# Patient Record
Sex: Male | Born: 1945
Health system: Southern US, Community
[De-identification: ages and names within clinical notes are randomized; demographics above are authoritative.]

## PROBLEM LIST (undated history)

## (undated) DIAGNOSIS — E042 Nontoxic multinodular goiter: Secondary | ICD-10-CM

## (undated) DIAGNOSIS — D696 Thrombocytopenia, unspecified: Secondary | ICD-10-CM

## (undated) DIAGNOSIS — I10 Essential (primary) hypertension: Secondary | ICD-10-CM

## (undated) DIAGNOSIS — M1990 Unspecified osteoarthritis, unspecified site: Secondary | ICD-10-CM

## (undated) HISTORY — DX: Nontoxic multinodular goiter: E04.2

## (undated) HISTORY — DX: Essential (primary) hypertension: I10

## (undated) HISTORY — PX: COLONOSCOPY: SHX174

## (undated) HISTORY — DX: Thrombocytopenia, unspecified: D69.6

---

## 1998-05-10 HISTORY — PX: SHOULDER ARTHROSCOPY: SHX128

## 2000-11-15 ENCOUNTER — Ambulatory Visit (HOSPITAL_COMMUNITY): Admission: RE | Admit: 2000-11-15 | Discharge: 2000-11-15 | Payer: Self-pay | Admitting: Family Medicine

## 2000-11-15 ENCOUNTER — Encounter: Payer: Self-pay | Admitting: Family Medicine

## 2002-05-10 HISTORY — PX: CERVICAL FUSION: SHX112

## 2002-08-29 ENCOUNTER — Encounter: Payer: Self-pay | Admitting: Family Medicine

## 2002-08-29 ENCOUNTER — Ambulatory Visit (HOSPITAL_COMMUNITY): Admission: RE | Admit: 2002-08-29 | Discharge: 2002-08-29 | Payer: Self-pay | Admitting: Family Medicine

## 2002-12-17 ENCOUNTER — Ambulatory Visit (HOSPITAL_COMMUNITY): Admission: RE | Admit: 2002-12-17 | Discharge: 2002-12-17 | Payer: Self-pay | Admitting: Neurosurgery

## 2002-12-26 ENCOUNTER — Encounter: Payer: Self-pay | Admitting: Neurosurgery

## 2002-12-26 ENCOUNTER — Observation Stay (HOSPITAL_COMMUNITY): Admission: RE | Admit: 2002-12-26 | Discharge: 2002-12-27 | Payer: Self-pay | Admitting: Neurosurgery

## 2007-01-20 ENCOUNTER — Ambulatory Visit (HOSPITAL_COMMUNITY): Admission: RE | Admit: 2007-01-20 | Discharge: 2007-01-20 | Payer: Self-pay | Admitting: Family Medicine

## 2007-02-23 ENCOUNTER — Encounter (HOSPITAL_COMMUNITY): Admission: RE | Admit: 2007-02-23 | Discharge: 2007-04-13 | Payer: Self-pay | Admitting: Endocrinology

## 2007-03-16 ENCOUNTER — Encounter: Admission: RE | Admit: 2007-03-16 | Discharge: 2007-03-16 | Payer: Self-pay | Admitting: Endocrinology

## 2007-03-16 ENCOUNTER — Other Ambulatory Visit: Admission: RE | Admit: 2007-03-16 | Discharge: 2007-03-16 | Payer: Self-pay | Admitting: Diagnostic Radiology

## 2007-03-16 ENCOUNTER — Encounter (INDEPENDENT_AMBULATORY_CARE_PROVIDER_SITE_OTHER): Payer: Self-pay | Admitting: Diagnostic Radiology

## 2008-02-05 ENCOUNTER — Ambulatory Visit: Payer: Self-pay | Admitting: General Practice

## 2009-05-10 HISTORY — PX: INGUINAL HERNIA REPAIR: SUR1180

## 2010-09-15 ENCOUNTER — Ambulatory Visit
Admission: RE | Admit: 2010-09-15 | Discharge: 2010-09-15 | Disposition: A | Discharge status: Home / Self Care | Payer: MEDICARE | Source: Ambulatory Visit | Attending: Family Medicine | Admitting: Family Medicine

## 2010-09-15 ENCOUNTER — Other Ambulatory Visit: Payer: Self-pay | Admitting: Family Medicine

## 2010-09-15 DIAGNOSIS — M549 Dorsalgia, unspecified: Secondary | ICD-10-CM

## 2010-09-25 NOTE — Op Note (Signed)
NAMECARRON, Donald Keith NO.:  [id number]   MEDICAL RECORD NO.:  [id number]                   PATIENT TYPE:  INP   LOCATION:  3003                                 FACILITY:  [facility]   PHYSICIAN:  Coletta Memos, M.D.                  DATE OF BIRTH:  [birth year]   DATE OF PROCEDURE:  12/26/2002  DATE OF DISCHARGE:                                 OPERATIVE REPORT   PREOPERATIVE DIAGNOSIS:  1. Displaced disk, C5-6, C6-7.  2. Cervical spondylosis with myelopathy C5-6, C6-7.  3. Cervical stenosis, C6-7.  4. Cervical radiculopathy.   POSTOPERATIVE DIAGNOSIS:  1. Displaced disk, C5-6, C6-7.  2. Cervical spondylosis with myelopathy C5-6, C6-7.  3. Cervical stenosis, C6-7.  4. Cervical radiculopathy.   OPERATION PERFORMED:  Anterior cervical decompression, C5-6, C6-7.  Anterior  arthrodesis C5 to C7.  Anterior instrumentation C5 to C7. Small stature  Synthes plate 34 mm.  Interbody grafts 7 mm tricortical.   SURGEON:  Coletta Memos, M.D.   ASSISTANT:  Hilda Lias, M.D.   ANESTHESIA:  General.   COMPLICATIONS:  None.   INDICATIONS FOR PROCEDURE:  Braeson Rupe is a 65 year old gentleman who  presented with severe pain in the left upper extremity.  He had severe  foraminal stenosis  and a herniated disk on the left side at C5-6 and C6-7.  I recommended and he agreed to undergo anterior cervical decompression and  fusion.   DESCRIPTION OF PROCEDURE:  Mr. Mohl was brought to the operating room,  intubated and placed under general anesthesia without difficulty.  His head  was placed in a horseshoe head rest under slight extension with 10 pounds of  traction applied via chin strap.  His neck was prepped and he was draped in  a sterile fashion.  I infiltrated 4mL of 0.5% lidocaine 1:200,000 strength  epinephrine into the region, starting in the midline, extending to the  medial border of the left sternocleidomastoid just under the cricoid  cartilage.  I made  the skin incision and took this down to the platysma.  I  dissected above the platysma both rostrally and caudally, then inferior to  the platysma rostrally and caudally. I opened the platysma in a horizontal  fashion.  I then created an avascular plane to the anterior cervical spine,  taking the omohyoid laterally.  I placed a spinal needle and it showed that  I was at C6-7.  I then reflected the longus colli muscles laterally on the  right and left sides.  I placed a distraction pin in C6 and one at C7.  I  opened the disk space with a #15 blade and distracted the disk space then.  I placed self-retaining retractor, brought the microscope into the operative  field.  I completed the diskectomy using pituitary rongeurs, curets and a  high speed drill.  There was a significant  amount of osteophytic spurring  present.  I was able to achieve excellent decompression, I felt of the C7  nerve roots at C6-7.  I placed a 7 mm graft and then turned my attention to  C5-6.  I removed the screw at C7 and placed it in C5.  I then distracted the  space but the screw at C6 then cracked.  So I removed that and the one at  C5.  I then used intervertebral spreaders at C5-6 and I completed the  diskectomy using high speed drill, curets, Kerrison punches also.  This  level was just as spondylitic with as much spurring, especially a large  posterior spur on the inferior posterior aspect of C5.  I was able to drill  right down and then remove it with Kerrison punches.  Decompressed the  spinal canal quite well and both C6 nerve roots.  I placed another 7 mm  graft at that level.  I then with Dr. Cassandria Santee assistance, placed the plate,  two screws in C5, one in C6, two in C7. The one screw was placed in C6  because I did not want to stress the bone since it had a fracture in it  already.  I first drove then tapped in place a screw at each level.  16 mm  rescue screw was placed in the top left position at C5.  X-ray  showed the  plate and screws to be in good position along with the bone plugs.  I then  irrigated the wound.  I then closed the wound in layered fashion using  Vicryl sutures.  Skin reapproximated with Dermabond.  The patient tolerated  the procedure well.                                                Coletta Memos, M.D.    KC/MEDQ  D:  12/26/2002  T:  12/27/2002  Job:  [id number]

## 2010-09-28 ENCOUNTER — Other Ambulatory Visit (HOSPITAL_COMMUNITY): Payer: Self-pay | Admitting: Internal Medicine

## 2010-09-28 DIAGNOSIS — E042 Nontoxic multinodular goiter: Secondary | ICD-10-CM

## 2010-10-08 ENCOUNTER — Encounter (HOSPITAL_COMMUNITY)
Admission: RE | Admit: 2010-10-08 | Discharge: 2010-10-08 | Disposition: A | Discharge status: Home / Self Care | Payer: 59 | Source: Ambulatory Visit | Attending: Internal Medicine | Admitting: Internal Medicine

## 2010-10-08 DIAGNOSIS — E042 Nontoxic multinodular goiter: Secondary | ICD-10-CM

## 2010-10-09 ENCOUNTER — Encounter (HOSPITAL_COMMUNITY)
Admission: RE | Admit: 2010-10-09 | Discharge: 2010-10-09 | Disposition: A | Discharge status: Home / Self Care | Payer: 59 | Source: Ambulatory Visit | Attending: Internal Medicine | Admitting: Internal Medicine

## 2010-10-09 DIAGNOSIS — E042 Nontoxic multinodular goiter: Secondary | ICD-10-CM | POA: Insufficient documentation

## 2010-10-09 MED ORDER — SODIUM IODIDE I 131 CAPSULE
10.7000 | Freq: Once | INTRAVENOUS | Status: AC | PRN
  Administered 2010-10-08: 10.7 via ORAL

## 2010-10-09 MED ORDER — SODIUM PERTECHNETATE TC 99M INJECTION
10.5000 | Freq: Once | INTRAVENOUS | Status: AC | PRN
  Administered 2010-10-09: 11 via INTRAVENOUS

## 2011-08-18 ENCOUNTER — Encounter (HOSPITAL_BASED_OUTPATIENT_CLINIC_OR_DEPARTMENT_OTHER): Payer: Self-pay | Admitting: *Deleted

## 2011-08-23 ENCOUNTER — Encounter (HOSPITAL_BASED_OUTPATIENT_CLINIC_OR_DEPARTMENT_OTHER): Payer: Self-pay | Admitting: Anesthesiology

## 2011-08-23 ENCOUNTER — Ambulatory Visit (HOSPITAL_BASED_OUTPATIENT_CLINIC_OR_DEPARTMENT_OTHER): Payer: 59 | Admitting: Anesthesiology

## 2011-08-23 ENCOUNTER — Encounter (HOSPITAL_BASED_OUTPATIENT_CLINIC_OR_DEPARTMENT_OTHER)
Admission: RE | Disposition: A | Discharge status: Home / Self Care | Payer: Self-pay | Source: Ambulatory Visit | Attending: Orthopedic Surgery

## 2011-08-23 ENCOUNTER — Encounter (HOSPITAL_BASED_OUTPATIENT_CLINIC_OR_DEPARTMENT_OTHER): Payer: Self-pay

## 2011-08-23 ENCOUNTER — Ambulatory Visit (HOSPITAL_BASED_OUTPATIENT_CLINIC_OR_DEPARTMENT_OTHER)
Admission: RE | Admit: 2011-08-23 | Discharge: 2011-08-23 | Disposition: A | Discharge status: Home / Self Care | Payer: 59 | Source: Ambulatory Visit | Attending: Orthopedic Surgery | Admitting: Orthopedic Surgery

## 2011-08-23 DIAGNOSIS — Z981 Arthrodesis status: Secondary | ICD-10-CM | POA: Insufficient documentation

## 2011-08-23 DIAGNOSIS — D1600 Benign neoplasm of scapula and long bones of unspecified upper limb: Secondary | ICD-10-CM | POA: Insufficient documentation

## 2011-08-23 DIAGNOSIS — M25819 Other specified joint disorders, unspecified shoulder: Secondary | ICD-10-CM | POA: Insufficient documentation

## 2011-08-23 DIAGNOSIS — M19019 Primary osteoarthritis, unspecified shoulder: Secondary | ICD-10-CM | POA: Insufficient documentation

## 2011-08-23 DIAGNOSIS — M7541 Impingement syndrome of right shoulder: Secondary | ICD-10-CM

## 2011-08-23 HISTORY — DX: Unspecified osteoarthritis, unspecified site: M19.90

## 2011-08-23 LAB — POCT HEMOGLOBIN-HEMACUE: Hemoglobin: 17 g/dL (ref 13.0–17.0)

## 2011-08-23 SURGERY — SHOULDER ARTHROSCOPY WITH SUBACROMIAL DECOMPRESSION
Anesthesia: General | Site: Shoulder | Laterality: Right | Wound class: Clean

## 2011-08-23 MED ORDER — SUCCINYLCHOLINE CHLORIDE 20 MG/ML IJ SOLN
INTRAMUSCULAR | Status: DC | PRN
  Administered 2011-08-23: 100 mg via INTRAVENOUS

## 2011-08-23 MED ORDER — FENTANYL CITRATE 0.05 MG/ML IJ SOLN
INTRAMUSCULAR | Status: DC | PRN
  Administered 2011-08-23: 25 ug via INTRAVENOUS
  Administered 2011-08-23: 100 ug via INTRAVENOUS
  Administered 2011-08-23 (×3): 25 ug via INTRAVENOUS

## 2011-08-23 MED ORDER — LACTATED RINGERS IV SOLN
INTRAVENOUS | Status: DC
  Administered 2011-08-23 (×2): via INTRAVENOUS

## 2011-08-23 MED ORDER — DEXAMETHASONE SODIUM PHOSPHATE 4 MG/ML IJ SOLN
INTRAMUSCULAR | Status: DC | PRN
  Administered 2011-08-23: 10 mg via INTRAVENOUS

## 2011-08-23 MED ORDER — SODIUM CHLORIDE 0.9 % IR SOLN
Status: DC | PRN
  Administered 2011-08-23: 3000 mL

## 2011-08-23 MED ORDER — FENTANYL CITRATE 0.05 MG/ML IJ SOLN
50.0000 ug | INTRAMUSCULAR | Status: DC | PRN
  Administered 2011-08-23: 100 ug via INTRAVENOUS

## 2011-08-23 MED ORDER — CEFAZOLIN SODIUM 1-5 GM-% IV SOLN
INTRAVENOUS | Status: DC | PRN
  Administered 2011-08-23: 2 g via INTRAVENOUS

## 2011-08-23 MED ORDER — MIDAZOLAM HCL 2 MG/2ML IJ SOLN
1.0000 mg | INTRAMUSCULAR | Status: DC | PRN
  Administered 2011-08-23: 2 mg via INTRAVENOUS

## 2011-08-23 MED ORDER — BUPIVACAINE-EPINEPHRINE PF 0.5-1:200000 % IJ SOLN
INTRAMUSCULAR | Status: DC | PRN
  Administered 2011-08-23: 30 mL

## 2011-08-23 MED ORDER — PROPOFOL 10 MG/ML IV EMUL
INTRAVENOUS | Status: DC | PRN
  Administered 2011-08-23: 250 mg via INTRAVENOUS
  Administered 2011-08-23: 50 mg via INTRAVENOUS

## 2011-08-23 MED ORDER — HYDROMORPHONE HCL PF 1 MG/ML IJ SOLN: 0.2500 mg | INTRAMUSCULAR | Status: DC | PRN

## 2011-08-23 MED ORDER — ONDANSETRON HCL 4 MG/2ML IJ SOLN
INTRAMUSCULAR | Status: DC | PRN
  Administered 2011-08-23: 4 mg via INTRAVENOUS

## 2011-08-23 MED ORDER — OXYCODONE-ACETAMINOPHEN 5-325 MG PO TABS: 1.0000 | ORAL_TABLET | ORAL | Status: AC | PRN

## 2011-08-23 SURGICAL SUPPLY — 85 items
BENZOIN TINCTURE PRP APPL 2/3 (GAUZE/BANDAGES/DRESSINGS) IMPLANT
BLADE 4.2CUDA (BLADE) IMPLANT
BLADE CUDA 5.5 (BLADE) IMPLANT
BLADE CUTTER GATOR 3.5 (BLADE) IMPLANT
BLADE GREAT WHITE 4.2 (BLADE) IMPLANT
BLADE SURG 15 STRL LF DISP TIS (BLADE) IMPLANT
BLADE SURG 15 STRL SS (BLADE)
BLADE SURG ROTATE 9660 (MISCELLANEOUS) IMPLANT
BLADE VORTEX 6.0 (BLADE) IMPLANT
BUR 3.5 LG SPHERICAL (BURR) IMPLANT
BUR OVAL 4.0 (BURR) ×2 IMPLANT
BUR OVAL 6.0 (BURR) IMPLANT
BURR 3.5 LG SPHERICAL (BURR)
CANISTER OMNI JUG 16 LITER (MISCELLANEOUS) ×2 IMPLANT
CANISTER SUCTION 2500CC (MISCELLANEOUS) IMPLANT
CANNULA 5.75X71 LONG (CANNULA) ×2 IMPLANT
CANNULA TWIST IN 8.25X7CM (CANNULA) IMPLANT
CHLORAPREP W/TINT 26ML (MISCELLANEOUS) ×2 IMPLANT
CLOTH BEACON ORANGE TIMEOUT ST (SAFETY) ×2 IMPLANT
DECANTER SPIKE VIAL GLASS SM (MISCELLANEOUS) IMPLANT
DERMABOND ADVANCED (GAUZE/BANDAGES/DRESSINGS)
DERMABOND ADVANCED .7 DNX12 (GAUZE/BANDAGES/DRESSINGS) IMPLANT
DRAPE INCISE IOBAN 66X45 STRL (DRAPES) ×2 IMPLANT
DRAPE STERI 35X30 U-POUCH (DRAPES) ×2 IMPLANT
DRAPE SURG 17X23 STRL (DRAPES) ×2 IMPLANT
DRAPE U 20/CS (DRAPES) ×2 IMPLANT
DRAPE U-SHAPE 47X51 STRL (DRAPES) ×2 IMPLANT
DRAPE U-SHAPE 76X120 STRL (DRAPES) ×4 IMPLANT
DRSG PAD ABDOMINAL 8X10 ST (GAUZE/BANDAGES/DRESSINGS) ×2 IMPLANT
ELECT REM PT RETURN 9FT ADLT (ELECTROSURGICAL) ×2
ELECTRODE REM PT RTRN 9FT ADLT (ELECTROSURGICAL) ×1 IMPLANT
GAUZE SPONGE 4X4 16PLY XRAY LF (GAUZE/BANDAGES/DRESSINGS) IMPLANT
GAUZE XEROFORM 1X8 LF (GAUZE/BANDAGES/DRESSINGS) ×2 IMPLANT
GLOVE BIO SURGEON STRL SZ 6.5 (GLOVE) ×2 IMPLANT
GLOVE BIO SURGEON STRL SZ7 (GLOVE) ×2 IMPLANT
GLOVE BIO SURGEON STRL SZ7.5 (GLOVE) ×2 IMPLANT
GLOVE BIOGEL PI IND STRL 7.0 (GLOVE) ×2 IMPLANT
GLOVE BIOGEL PI IND STRL 8 (GLOVE) ×2 IMPLANT
GLOVE BIOGEL PI INDICATOR 7.0 (GLOVE) ×2
GLOVE BIOGEL PI INDICATOR 8 (GLOVE) ×2
GOWN PREVENTION PLUS XLARGE (GOWN DISPOSABLE) ×6 IMPLANT
NDL SUT 6 .5 CRC .975X.05 MAYO (NEEDLE) IMPLANT
NEEDLE 1/2 CIR CATGUT .05X1.09 (NEEDLE) IMPLANT
NEEDLE MAYO TAPER (NEEDLE)
NEEDLE SCORPION MULTI FIRE (NEEDLE) IMPLANT
NS IRRIG 1000ML POUR BTL (IV SOLUTION) IMPLANT
PACK ARTHROSCOPY DSU (CUSTOM PROCEDURE TRAY) ×2 IMPLANT
PACK BASIN DAY SURGERY FS (CUSTOM PROCEDURE TRAY) ×2 IMPLANT
PENCIL BUTTON HOLSTER BLD 10FT (ELECTRODE) IMPLANT
RESECTOR FULL RADIUS 4.2MM (BLADE) ×2 IMPLANT
RESECTOR FULL RADIUS 4.8MM (BLADE) IMPLANT
SHEET MEDIUM DRAPE 40X70 STRL (DRAPES) IMPLANT
SLEEVE SCD COMPRESS KNEE MED (MISCELLANEOUS) ×2 IMPLANT
SLING ARM FOAM STRAP LRG (SOFTGOODS) ×2 IMPLANT
SLING ARM FOAM STRAP MED (SOFTGOODS) IMPLANT
SLING ARM FOAM STRAP XLG (SOFTGOODS) IMPLANT
SLING ARM IMMOBILIZER MED (SOFTGOODS) IMPLANT
SPONGE GAUZE 4X4 12PLY (GAUZE/BANDAGES/DRESSINGS) ×2 IMPLANT
SPONGE LAP 4X18 X RAY DECT (DISPOSABLE) IMPLANT
STRIP CLOSURE SKIN 1/2X4 (GAUZE/BANDAGES/DRESSINGS) IMPLANT
SUCTION FRAZIER TIP 10 FR DISP (SUCTIONS) IMPLANT
SUPPORT WRAP ARM LG (MISCELLANEOUS) IMPLANT
SUT BONE WAX W31G (SUTURE) IMPLANT
SUT ETHIBOND 2 OS 4 DA (SUTURE) IMPLANT
SUT ETHILON 3 0 PS 1 (SUTURE) ×2 IMPLANT
SUT ETHILON 4 0 PS 2 18 (SUTURE) IMPLANT
SUT FIBERWIRE #2 38 T-5 BLUE (SUTURE)
SUT FIBERWIRE 2-0 18 17.9 3/8 (SUTURE)
SUT MNCRL AB 3-0 PS2 18 (SUTURE) IMPLANT
SUT MNCRL AB 4-0 PS2 18 (SUTURE) IMPLANT
SUT PDS AB 0 CT 36 (SUTURE) IMPLANT
SUT PROLENE 3 0 PS 2 (SUTURE) IMPLANT
SUT VIC AB 0 CT1 18XCR BRD 8 (SUTURE) IMPLANT
SUT VIC AB 0 CT1 8-18 (SUTURE)
SUT VIC AB 2-0 SH 18 (SUTURE) IMPLANT
SUTURE FIBERWR #2 38 T-5 BLUE (SUTURE) IMPLANT
SUTURE FIBERWR 2-0 18 17.9 3/8 (SUTURE) IMPLANT
SYR BULB 3OZ (MISCELLANEOUS) IMPLANT
TOWEL OR 17X24 6PK STRL BLUE (TOWEL DISPOSABLE) ×2 IMPLANT
TOWEL OR NON WOVEN STRL DISP B (DISPOSABLE) ×2 IMPLANT
TUBE CONNECTING 20X1/4 (TUBING) ×2 IMPLANT
TUBING ARTHROSCOPY IRRIG 16FT (MISCELLANEOUS) ×2 IMPLANT
WAND STAR VAC 90 (SURGICAL WAND) ×2 IMPLANT
WATER STERILE IRR 1000ML POUR (IV SOLUTION) ×2 IMPLANT
YANKAUER SUCT BULB TIP NO VENT (SUCTIONS) IMPLANT

## 2011-08-23 NOTE — Anesthesia Preprocedure Evaluation (Signed)
Anesthesia Evaluation  Patient identified by MRN, date of birth, ID band Patient awake    Reviewed: Allergy & Precautions, H&P , NPO status , Patient's Chart, lab work & pertinent test results  History of Anesthesia Complications Negative for: history of anesthetic complications  Airway Mallampati: II TM Distance: >3 FB Neck ROM: Full    Dental No notable dental hx. (+) Teeth Intact and Dental Advisory Given   Pulmonary neg pulmonary ROS,  breath sounds clear to auscultation  Pulmonary exam normal       Cardiovascular negative cardio ROS  Rhythm:Regular Rate:Normal     Neuro/Psych negative neurological ROS     GI/Hepatic negative GI ROS, Neg liver ROS,   Endo/Other  Morbid obesityGoiter, asymptomatic  Renal/GU negative Renal ROS     Musculoskeletal   Abdominal (+) + obese,   Peds  Hematology   Anesthesia Other Findings   Reproductive/Obstetrics                           Anesthesia Physical Anesthesia Plan  ASA: II  Anesthesia Plan: General   Post-op Pain Management:    Induction: Intravenous  Airway Management Planned: Oral ETT  Additional Equipment:   Intra-op Plan:   Post-operative Plan: Extubation in OR  Informed Consent: I have reviewed the patients History and Physical, chart, labs and discussed the procedure including the risks, benefits and alternatives for the proposed anesthesia with the patient or authorized representative who has indicated his/her understanding and acceptance.   Dental advisory given  Plan Discussed with: Surgeon and CRNA  Anesthesia Plan Comments: (Plan routine monitors, GETA with interscalene block for post-op analgesia)        Anesthesia Quick Evaluation

## 2011-08-23 NOTE — Transfer of Care (Signed)
Immediate Anesthesia Transfer of Care Note  Patient: Donald Keith  Procedure(s) Performed: Procedure(s) (LRB): SHOULDER ARTHROSCOPY WITH SUBACROMIAL DECOMPRESSION (Right)  Patient Location: PACU  Anesthesia Type: General and Regional  Level of Consciousness: awake, alert  and oriented  Airway & Oxygen Therapy: Patient Spontanous Breathing and Patient connected to face mask oxygen  Post-op Assessment: Report given to PACU RN and Post -op Vital signs reviewed and stable  Post vital signs: Reviewed and stable  Complications: No apparent anesthesia complications

## 2011-08-23 NOTE — Op Note (Signed)
Procedure(s): SHOULDER ARTHROSCOPY WITH SUBACROMIAL DECOMPRESSION Procedure Note  Donald Keith male 66 y.o. 08/23/2011  Procedure(s) and Anesthesia Type:    * Right SHOULDER ARTHROSCOPY WITH SUBACROMIAL DECOMPRESSION and distal clavicle excision  - General  Postoperative diagnosis: Severe lateral anterior acromial spurring with impingement and a.c. joint DJD.  Surgeon(s) and Role:    * Mable Paris, MD - Primary     Surgeon: Mable Paris   Assistants: Damita Lack PA-S.  Anesthesia: General endotracheal anesthesia and regional    Procedure Detail  SHOULDER ARTHROSCOPY WITH SUBACROMIAL DECOMPRESSION and distal clavicle excision  Estimated Blood Loss: Min         Drains: none  Blood Given: none         Specimens: none        Complications:  * No complications entered in OR log *         Disposition: PACU - hemodynamically stable.         Condition: stable    Procedure:   INDICATIONS FOR SURGERY: The patient is 66 y.o. male who had a history of prior subacromial decompression. He went on to have recent severe exacerbation of shoulder pain. MRI revealed no tearing of the rotator cuff the x-ray and MRI showed severe reaccumulation of lateral and anterior bone spurs which were sharp and digging down into the rotator cuff. He also had symptomatic a.c. joint DJD. He failed conservative management and wished to go forward with surgical treatment. He understood risks benefits alternatives to the procedure including but not limited to risk of bleeding infection damage to neurovascular structures and the potential for incomplete pain relief. He elected to go forward with surgery.  OPERATIVE FINDINGS: Examination under anesthesia: No stiffness or instability Diagnostic Arthroscopy:  Glenoid articular cartilage: Intact Humeral head articular cartilage: Intact Labrum: Degenerative superior and posterior labrum tearing debrided Loose bodies:  None Synovitis: None Articular sided rotator cuff: Intact. Mild partial-thickness infraspinatus elevation. Bursal sided rotator cuff: Frayed but intact Coracoacromial ligament: Scarred He had a very large lateral acromial spur extending down laterally in a more moderate anterior spur. He also had significant degenerative changes at the a.c. joint.  DESCRIPTION OF PROCEDURE: The patient was identified in preoperative  holding area where I personally marked the operative site after  verifying site, side, and procedure with the patient. An interscalene block was given by the attending anesthesiologist the holding area.  The patient was taken back to the operating room where general anesthesia was induced without complication and was placed in the beach-chair position with the back  elevated about 60 degrees and all extremities and head and neck carefully padded and  positioned.   The right upper extremity was then prepped and  draped in a standard sterile fashion. The appropriate time-out  procedure was carried out. The patient did receive IV antibiotics  within 30 minutes of incision.   A small posterior portal incision was made and the arthroscope was introduced into the joint. An anterior portal was then established above the subscapularis using needle localization. Small cannula was placed anteriorly. Diagnostic arthroscopy was then carried out with findings as described above.  The shaver was introduced through the anterior portal to gently debride the superior and posterior labral tears down to stable base. The repair was felt necessary. The biceps tendon was intact.  The arthroscope was then introduced into the subacromial space a lateral portal was established with needle localization. The portal was made just anterior to the large lateral acromial spur  which was palpable. The shaver was used through the lateral portal to perform extensive bursectomy. There was significant hypertrophy  bursitis. Coracoacromial ligament was examined and found to be scarred from previous surgery.  Using the ArthroCare A. carefully exposed the lateral and anterior acromial spurs. The lateral spur was noted to extend significantly lateral and inferior down into the lateral gutter. This was carefully exposed. A 4 mm bur was then used from the lateral portal to carefully excise the spur back to the native lateral acromion. I then turned my attention to the anterior acromion where he had some re\re formation of anterior spur as well. This was taken up to a smooth even level. The a.c. joint was exposed arthroscopically. The anterior portal was placed into the a.c. joint and the ArthriCare and bur were used through the anterior cannula to even out and smoothed the lateral acromion at the a.c. joint and then to remove about 8 mm of the distal clavicle and a smooth even resection. The superior and posterior capsule were kept intact. The acromioplasty and distal clavicle excision were then viewed from the lateral portal and the acromioplasty was felt to be complete with significant increase space for the rotator cuff anteriorly and laterally. Slight residual downslope anteriorly was smooth even. I also viewed the a.c. resection from anteriorly and a smooth even. The shaver was then run in the joint a little bit more to remove any bone dust and the arthroscopic equipment was removed from the joint.  TThe arthroscopic equipment was removed from the joint and the portals were closed with 3-0 nylon in an interrupted fashion. Sterile dressings were then applied including Xeroform 4 x 4's ABDs and tape. The patient was then allowed to awaken from general anesthesia, placed in a sling, transferred to the stretcher and taken to the recovery room in stable condition.   POSTOPERATIVE PLAN: The patient will be discharged home today and will followup in one week for suture removal and wound check.  He will begin some gentle range  of motion exercises when comfortable.

## 2011-08-23 NOTE — Anesthesia Procedure Notes (Addendum)
Anesthesia Regional Block:  Interscalene brachial plexus block  Pre-Anesthetic Checklist: ,, timeout performed, Correct Patient, Correct Site, Correct Laterality, Correct Procedure, Correct Position, site marked, Risks and benefits discussed,  Surgical consent,  Pre-op evaluation,  At surgeon's request and post-op pain management  Laterality: Right  Prep: chloraprep       Needles:  Injection technique: Single-shot  Needle Type: Stimulator Needle - 40      Needle Gauge: 22 and 22 G    Additional Needles:  Procedures: nerve stimulator Interscalene brachial plexus block  Nerve Stimulator or Paresthesia:  Response: forearm twitch, 0.45 mA, 0.1 ms,   Additional Responses:   Narrative:  Start time: 08/23/2011 11:56 AM End time: 08/23/2011 12:02 PM Injection made incrementally with aspirations every 5 mL.  Performed by: Personally  Anesthesiologist: Sandford Craze, MD  Additional Notes: Pt identified in Holding room.  Monitors applied. Working IV access confirmed. Sterile prep R neck.  #22ga PNS to forearm twitch at 0.61mA threshold.  30cc 0.5% Bupivacaine with 1:200k epi injected incrementally after negative test dose.  Patient asymptomatic, VSS, no heme aspirated, tolerated well.   Sandford Craze, MD  Interscalene brachial plexus block Procedure Name: Intubation Date/Time: 08/23/2011 12:36 PM Performed by: Burna Cash Pre-anesthesia Checklist: Patient identified, Emergency Drugs available, Suction available and Patient being monitored Patient Re-evaluated:Patient Re-evaluated prior to inductionOxygen Delivery Method: Circle System Utilized Preoxygenation: Pre-oxygenation with 100% oxygen Intubation Type: IV induction Ventilation: Mask ventilation without difficulty Laryngoscope Size: Mac and 3 Grade View: Grade I Tube type: Oral Tube size: 8.0 mm Number of attempts: 1 Airway Equipment and Method: stylet and oral airway Placement Confirmation: ETT inserted through vocal cords  under direct vision,  positive ETCO2 and breath sounds checked- equal and bilateral Secured at: 22 cm Tube secured with: Tape Dental Injury: Teeth and Oropharynx as per pre-operative assessment

## 2011-08-23 NOTE — H&P (Signed)
Donald Keith is an 66 y.o. male.   Chief Complaint: R shoulder pain  HPI: R shoulder impingement and AC DJD, failed conservative treatment.  Past Medical History  Diagnosis Date  . Arthritis   . No pertinent past medical history     Past Surgical History  Procedure Date  . Cervical fusion 2004  . Shoulder arthroscopy 2000    rt  . Colonoscopy     History reviewed. No pertinent family history. Social History:  reports that he has never smoked. He does not have any smokeless tobacco history on file. He reports that he does not drink alcohol or use illicit drugs.  Allergies:  Allergies  Allergen Reactions  . Codeine Nausea And Vomiting    Medications Prior to Admission  Medication Dose Route Frequency Provider Last Rate Last Dose  . fentaNYL (SUBLIMAZE) injection 50-100 mcg  50-100 mcg Intravenous PRN Germaine Pomfret, MD   100 mcg at 08/23/11 1155  . lactated ringers infusion   Intravenous Continuous Bedelia Person, MD 20 mL/hr at 08/23/11 1140    . midazolam (VERSED) injection 1-2 mg  1-2 mg Intravenous PRN E. Jairo Ben, MD   2 mg at 08/23/11 1155   Medications Prior to Admission  Medication Sig Dispense Refill  . Multiple Vitamin (MULTIVITAMIN) capsule Take 1 capsule by mouth daily.        Results for orders placed during the hospital encounter of 08/23/11 (from the past 48 hour(s))  POCT HEMOGLOBIN-HEMACUE     Status: Normal   Collection Time   08/23/11 11:45 AM      Component Value Range Comment   Hemoglobin 17.0  13.0 - 17.0 (g/dL)    No results found.  Review of Systems  All other systems reviewed and are negative.    Blood pressure 139/72, pulse 71, temperature 98.4 F (36.9 C), temperature source Oral, resp. rate 14, height 6' (1.829 m), weight 108.863 kg (240 lb), SpO2 99.00%. Physical Exam  Constitutional: He is oriented to person, place, and time. He appears well-developed and well-nourished.  HENT:  Head: Atraumatic.  Eyes: EOM are normal.    Cardiovascular: Intact distal pulses.   Respiratory: Effort normal.  Musculoskeletal:       TTP over R AC and RC  Neurological: He is alert and oriented to person, place, and time.  Skin: Skin is warm and dry.  Psychiatric: He has a normal mood and affect.     Assessment/Plan  R shoulder impingement and AC DJD, failed conservative treatment. Plan RSA with SAD and DCR Risks / benefits of surgery discussed Consent on chart  NPO for OR Preop antibiotics   Jacquel Redditt WILLIAM 08/23/2011, 12:28 PM

## 2011-08-23 NOTE — Discharge Instructions (Signed)
Discharge Instructions after Arthroscopic Shoulder Surgery ° ° °A sling has been provided for you. You may remove the sling after 72 hours. The sling may be worn for your protection, if you are in a crowd.  °Use ice on the shoulder intermittently over the first 48 hours after surgery.  °Pain medication has been prescribed for you.  °Use your medication liberally over the first 48 hours, and then begin to taper your use. You may take Extra Strength Tylenol or Tylenol only in place of the pain pills. DO NOT take ANY nonsteroidal anti-inflammatory pain medications: Advil, Motrin, Ibuprofen, Aleve, Naproxen, or Naprosyn.  °You may remove your dressing after two days.  °You may shower 5 days after surgery. The incision CANNOT get wet prior to 5 days. Simply allow the water to wash over the site and then pat dry. Do not rub the incision. Make sure your axilla (armpit) is completely dry after showering.  °Take one aspirin a day for 2 weeks after surgery, unless you have an aspirin sensitivity/allergy or asthma.  °Three to 5 times each day you should perform assisted overhead reaching and external rotation (outward turning) exercises with the operative arm. Both exercises should be done with the non-operative arm used as the "therapist arm" while the operative arm remains relaxed. Ten of each exercise should be done three to five times each day. ° ° ° °Overhead reach is helping to lift your stiff arm up as high as it will go. To stretch your overhead reach, lie flat on your back, relax, and grasp the wrist of the tight shoulder with your opposite hand. Using the power in your opposite arm, bring the stiff arm up as far as it is comfortable. Start holding it for ten seconds and then work up to where you can hold it for a count of 30. Breathe slowly and deeply while the arm is moved. Repeat this stretch ten times, trying to help the arm up a little higher each time.  ° ° ° ° ° °External rotation is turning the arm out to  the side while your elbow stays close to your body. External rotation is best stretched while you are lying on your back. Hold a cane, yardstick, broom handle, or dowel in both hands. Bend both elbows to a right angle. Use steady, gentle force from your normal arm to rotate the hand of the stiff shoulder out away from your body. Continue the rotation as far as it will go comfortably, holding it there for a count of 10. Repeat this exercise ten times.  ° ° ° °Please call [phone] during normal business hours or [phone] after hours for any problems. Including the following: ° °- excessive redness of the incisions °- drainage for more than 4 days °- fever of more than 101.5 F ° °*Please note that pain medications will not be refilled after hours or on weekends. ° ° ° °Post Anesthesia Home Care Instructions ° °Activity: °Get plenty of rest for the remainder of the day. A responsible adult should stay with you for 24 hours following the procedure.  °For the next 24 hours, DO NOT: °-Drive a car °-Operate machinery °-Drink alcoholic beverages °-Take any medication unless instructed by your physician °-Make any legal decisions or sign important papers. ° °Meals: °Start with liquid foods such as gelatin or soup. Progress to regular foods as tolerated. Avoid greasy, spicy, heavy foods. If nausea and/or vomiting occur, drink only clear liquids until the nausea and/or vomiting subsides. Call   your physician if vomiting continues. ° °Special Instructions/Symptoms: °Your throat may feel dry or sore from the anesthesia or the breathing tube placed in your throat during surgery. If this causes discomfort, gargle with warm salt water. The discomfort should disappear within 24 hours. ° ° °Regional Anesthesia Blocks ° °1. Numbness or the inability to move the "blocked" extremity may last from 3-48 hours after placement. The length of time depends on the medication injected and your individual response to the medication. If the  numbness is not going away after 48 hours, call your surgeon. ° °2. The extremity that is blocked will need to be protected until the numbness is gone and the  Strength has returned. Because you cannot feel it, you will need to take extra care to avoid injury. Because it may be weak, you may have difficulty moving it or using it. You may not know what position it is in without looking at it while the block is in effect. ° °3. For blocks in the legs and feet, returning to weight bearing and walking needs to be done carefully. You will need to wait until the numbness is entirely gone and the strength has returned. You should be able to move your leg and foot normally before you try and bear weight or walk. You will need someone to be with you when you first try to ensure you do not fall and possibly risk injury. ° °4. Bruising and tenderness at the needle site are common side effects and will resolve in a few days. ° °5. Persistent numbness or new problems with movement should be communicated to the surgeon or the [facility] ([phone])/ [facility] ([phone]). °

## 2011-08-24 NOTE — Anesthesia Postprocedure Evaluation (Signed)
  Anesthesia Post-op Note  Patient: Donald Keith  Procedure(s) Performed: Procedure(s) (LRB): SHOULDER ARTHROSCOPY WITH SUBACROMIAL DECOMPRESSION (Right)  Patient Location: PACU  Anesthesia Type: GA combined with regional for post-op pain  Level of Consciousness: awake, alert  and oriented  Airway and Oxygen Therapy: Patient Spontanous Breathing  Post-op Pain: none  Post-op Assessment: Post-op Vital signs reviewed, Patient's Cardiovascular Status Stable, Respiratory Function Stable, Patent Airway, No signs of Nausea or vomiting and Pain level controlled  Post-op Vital Signs: Reviewed and stable  Complications: No apparent anesthesia complications, patient discharged prior to post op visit, verbal report as above

## 2011-08-27 ENCOUNTER — Encounter (HOSPITAL_BASED_OUTPATIENT_CLINIC_OR_DEPARTMENT_OTHER): Payer: Self-pay

## 2012-06-20 ENCOUNTER — Ambulatory Visit
Admission: RE | Admit: 2012-06-20 | Discharge: 2012-06-20 | Disposition: A | Discharge status: Home / Self Care | Payer: Medicare Other | Source: Ambulatory Visit | Attending: Family Medicine | Admitting: Family Medicine

## 2012-06-20 ENCOUNTER — Other Ambulatory Visit: Payer: Self-pay | Admitting: Family Medicine

## 2012-06-20 DIAGNOSIS — M25562 Pain in left knee: Secondary | ICD-10-CM

## 2012-08-28 ENCOUNTER — Telehealth: Payer: Self-pay | Admitting: Internal Medicine

## 2012-08-28 NOTE — Telephone Encounter (Signed)
Left pt vm to return call about np appt.

## 2012-09-07 ENCOUNTER — Telehealth: Payer: Self-pay | Admitting: Internal Medicine

## 2012-09-07 NOTE — Telephone Encounter (Signed)
S/w pt in re to NP appt 06/02 w/Dr. Arbutus Ped.  Referring Dr. Arvella Merles Dx- Thrombocytopenia Welcome packet mailed.

## 2012-09-12 ENCOUNTER — Telehealth: Payer: Self-pay | Admitting: Internal Medicine

## 2012-09-12 NOTE — Telephone Encounter (Signed)
C/D 09/12/12 for appt. 10/09/12

## 2012-10-05 ENCOUNTER — Telehealth: Payer: Self-pay | Admitting: Internal Medicine

## 2012-10-05 NOTE — Telephone Encounter (Signed)
LVOM FOR PT IN RE TO APPT CANCEL INSTRUCTED PT TO CALL TO CONFIRM CALLED WAS RECEIVED.

## 2012-10-09 ENCOUNTER — Ambulatory Visit: Payer: Medicare Other

## 2012-10-09 ENCOUNTER — Ambulatory Visit: Payer: Medicare Other | Admitting: Internal Medicine

## 2012-10-09 ENCOUNTER — Other Ambulatory Visit: Payer: Medicare Other | Admitting: Lab

## 2013-08-03 ENCOUNTER — Encounter: Payer: Self-pay | Admitting: *Deleted

## 2015-03-14 ENCOUNTER — Telehealth: Payer: Self-pay | Admitting: Internal Medicine

## 2015-03-14 NOTE — Telephone Encounter (Signed)
S/W PT IN REF TO NP APPT. ON 04/14/15@1 :45 REFERRING DR HARRIS THROMBOCYTOPENIA

## 2015-04-11 ENCOUNTER — Other Ambulatory Visit: Payer: Self-pay | Admitting: Medical Oncology

## 2015-04-11 DIAGNOSIS — D696 Thrombocytopenia, unspecified: Secondary | ICD-10-CM

## 2015-04-14 ENCOUNTER — Ambulatory Visit (HOSPITAL_BASED_OUTPATIENT_CLINIC_OR_DEPARTMENT_OTHER): Payer: PPO | Admitting: Internal Medicine

## 2015-04-14 ENCOUNTER — Telehealth: Payer: Self-pay | Admitting: Internal Medicine

## 2015-04-14 ENCOUNTER — Encounter: Payer: Self-pay | Admitting: Internal Medicine

## 2015-04-14 ENCOUNTER — Other Ambulatory Visit (HOSPITAL_BASED_OUTPATIENT_CLINIC_OR_DEPARTMENT_OTHER): Payer: PPO

## 2015-04-14 VITALS — BP 162/87 | HR 71 | Temp 98.4°F | Resp 19 | Ht 72.0 in | Wt 231.4 lb

## 2015-04-14 DIAGNOSIS — D696 Thrombocytopenia, unspecified: Secondary | ICD-10-CM

## 2015-04-14 DIAGNOSIS — E042 Nontoxic multinodular goiter: Secondary | ICD-10-CM | POA: Diagnosis not present

## 2015-04-14 DIAGNOSIS — D693 Immune thrombocytopenic purpura: Secondary | ICD-10-CM

## 2015-04-14 LAB — CBC WITH DIFFERENTIAL/PLATELET
BASO%: 1.4 % (ref 0.0–2.0)
BASOS ABS: 0.1 10*3/uL (ref 0.0–0.1)
EOS%: 1.2 % (ref 0.0–7.0)
Eosinophils Absolute: 0.1 10*3/uL (ref 0.0–0.5)
HCT: 46.9 % (ref 38.4–49.9)
HGB: 15.7 g/dL (ref 13.0–17.1)
LYMPH%: 25.5 % (ref 14.0–49.0)
MCH: 32 pg (ref 27.2–33.4)
MCHC: 33.6 g/dL (ref 32.0–36.0)
MCV: 95.4 fL (ref 79.3–98.0)
MONO#: 0.7 10*3/uL (ref 0.1–0.9)
MONO%: 8.2 % (ref 0.0–14.0)
NEUT#: 5.4 10*3/uL (ref 1.5–6.5)
NEUT%: 63.7 % (ref 39.0–75.0)
Platelets: 108 10*3/uL — ABNORMAL LOW (ref 140–400)
RBC: 4.91 10*6/uL (ref 4.20–5.82)
RDW: 13.7 % (ref 11.0–14.6)
WBC: 8.5 10*3/uL (ref 4.0–10.3)
lymph#: 2.2 10*3/uL (ref 0.9–3.3)

## 2015-04-14 LAB — COMPREHENSIVE METABOLIC PANEL
ALT: 15 U/L (ref 0–55)
ANION GAP: 10 meq/L (ref 3–11)
AST: 16 U/L (ref 5–34)
Albumin: 4.2 g/dL (ref 3.5–5.0)
Alkaline Phosphatase: 71 U/L (ref 40–150)
BUN: 13.9 mg/dL (ref 7.0–26.0)
CHLORIDE: 108 meq/L (ref 98–109)
CO2: 21 meq/L — AB (ref 22–29)
CREATININE: 1.1 mg/dL (ref 0.7–1.3)
Calcium: 9.3 mg/dL (ref 8.4–10.4)
EGFR: 70 mL/min/{1.73_m2} — ABNORMAL LOW (ref >90)
Glucose: 97 mg/dl (ref 70–140)
POTASSIUM: 4.1 meq/L (ref 3.5–5.1)
Sodium: 139 mEq/L (ref 136–145)
Total Bilirubin: 0.68 mg/dL (ref 0.20–1.20)
Total Protein: 7.6 g/dL (ref 6.4–8.3)

## 2015-04-14 NOTE — Progress Notes (Signed)
[place] Telephone:(336) [phone]   Fax:([phone]  CONSULT NOTE  REFERRING PHYSICIAN: Dr. Azalia Bilis  REASON FOR CONSULTATION:  69 years old white male with persistent thrombocytopenia  HPI Donald Keith is a 69 y.o. male with past medical history significant for nontoxic goiter currently on observation as well as history of osteoarthritis. The patient was seen recently by his primary care physician Dr. Kenton Kingfisher for routine annual exam and follow-up. CBC performed on 02/11/2015 showed low platelets count of 83,000. The patient has thrombocytopenia for few years. On 07/24/2012 his platelets count were 97,000. On 09/07/2013 his platelets count were 108,000 and on 11/05/2014 his platelets count were 89,000. He was referred to me for further evaluation and recommendation regarding his persistent thrombocytopenia. The patient is feeling fine and no specific complaints. He denied having any significant bleeding, bruises or ecchymosis. He has no gum or nose bleeding. The patient used to take ibuprofen for arthritis but quit taking this medication 6 months ago. He takes only multivitamins as needed basis.  He denied having any significant weight loss or night sweats. The patient denied having any nausea, vomiting, diarrhea or constipation. He has no chest pain, shortness breath, cough or hemoptysis. His family history significant for father with heart disease, diabetes and skin cancer. Mother had myocardial infarction and hypertension. He also has a sister who died from complication of diabetes mellitus and hypertension. The patient is single and has no children. He works as a Dealer. He has no history of smoking, alcohol or drug abuse.  HPI  Past Medical History  Diagnosis Date  . Arthritis   . Multinodular goiter   . Thrombocytopenia [facility])     Past Surgical History  Procedure Laterality Date  . Cervical fusion  2004    C5-7  . Shoulder arthroscopy  2000    right  rotator cuff  . Colonoscopy    . Inguinal hernia repair Bilateral 2011    Family History  Problem Relation Age of Onset  . Skin cancer Father   . Heart disease Father   . Diabetes Father   . Heart attack Mother   . Hypertension Mother   . Diabetes Mother   . Diabetes Maternal Grandfather   . Diabetes Maternal Grandmother   . Diabetes Sister   . Hyperlipidemia Sister     Social History Social History  Substance Use Topics  . Smoking status: Never Smoker   . Smokeless tobacco: None  . Alcohol Use: No    Allergies  Allergen Reactions  . Codeine Nausea And Vomiting    No current outpatient prescriptions on file.   No current facility-administered medications for this visit.    Review of Systems  Constitutional: negative Eyes: negative Ears, nose, mouth, throat, and face: negative Respiratory: negative Cardiovascular: negative Gastrointestinal: negative Genitourinary:negative Integument/breast: negative Hematologic/lymphatic: negative Musculoskeletal:negative Neurological: negative Behavioral/Psych: negative Endocrine: negative Allergic/Immunologic: negative  Physical Exam  [id number], healthy, no distress, well nourished and well developed SKIN: skin color, texture, turgor are normal, no rashes or significant lesions HEAD: Normocephalic, No masses, lesions, tenderness or abnormalities EYES: normal, PERRLA EARS: External ears normal, Canals clear OROPHARYNX:no exudate, no erythema and lips, buccal mucosa, and tongue normal  NECK: supple, no adenopathy, no JVD LYMPH:  no palpable lymphadenopathy, no hepatosplenomegaly LUNGS: clear to auscultation , and palpation HEART: regular rate & rhythm, no murmurs and no gallops ABDOMEN:abdomen soft, non-tender, normal bowel sounds and no masses or organomegaly BACK: Back symmetric, no curvature., No CVA  tenderness EXTREMITIES:no joint deformities, effusion, or inflammation, no edema, no skin discoloration  NEURO:  alert & oriented x 3 with fluent speech, no focal motor/sensory deficits  PERFORMANCE STATUS: ECOG 0  LABORATORY DATA: Lab Results  Component Value Date   WBC 8.5 04/14/2015   HGB 15.7 04/14/2015   HCT 46.9 04/14/2015   MCV 95.4 04/14/2015   PLT 108 Large platelets present* 04/14/2015      Chemistry      Component Value Date/Time   NA 139 04/14/2015 1323   K 4.1 04/14/2015 1323   CO2 21* 04/14/2015 1323   BUN 13.9 04/14/2015 1323   CREATININE 1.1 04/14/2015 1323      Component Value Date/Time   CALCIUM 9.3 04/14/2015 1323   ALKPHOS 71 04/14/2015 1323   AST 16 04/14/2015 1323   ALT 15 04/14/2015 1323   BILITOT 0.68 04/14/2015 1323       RADIOGRAPHIC STUDIES: No results found.  ASSESSMENT: This is a very pleasant 69 years old white male with persistent thrombocytopenia most likely immune mediated thrombocytopenic purpura in a patient with significant thyroid goiter. He is not currently on any medications that could resulted this thrombocytopenia. He has no other cytopenias. I personally reviewed the peripheral blood smear and it showed large platelet but no other concerning findings.  PLAN: I had a lengthy discussion with the patient about his condition. I assured the patient that his platelets count are not critically low. His platelets count today are 108,000 which is much better than what the patient had few weeks ago. I recommended for him to continue on observation with routine follow-up visit by his primary care physician but I will also see the patient back for follow-up visit in 6 months for reevaluation with repeat CBC and LDH. I would consider the patient for treatment to his platelets count are less than 30,000 or the patient had significant bleeding issues. The patient was advised to call immediately if he has any concerning symptoms in the interval. The patient voices understanding of current disease status and treatment options and is in agreement with the  current care plan.  All questions were answered. The patient knows to call the clinic with any problems, questions or concerns. We can certainly see the patient much sooner if necessary.  Thank you so much for allowing me to participate in the care of Tresa Res. I will continue to follow up the patient with you and assist in his care.  I spent 40 minutes counseling the patient face to face. The total time spent in the appointment was 60 minutes.  Disclaimer: This note was dictated with voice recognition software. Similar sounding words can inadvertently be transcribed and may not be corrected upon review.   Haider Hornaday K. April 14, 2015, 2:45 PM

## 2015-04-14 NOTE — Telephone Encounter (Signed)
per pof to sch pt appt-gave pt copy of avs °

## 2015-10-13 ENCOUNTER — Ambulatory Visit (HOSPITAL_BASED_OUTPATIENT_CLINIC_OR_DEPARTMENT_OTHER): Payer: PPO | Admitting: Internal Medicine

## 2015-10-13 ENCOUNTER — Other Ambulatory Visit (HOSPITAL_BASED_OUTPATIENT_CLINIC_OR_DEPARTMENT_OTHER): Payer: PPO

## 2015-10-13 ENCOUNTER — Telehealth: Payer: Self-pay | Admitting: Internal Medicine

## 2015-10-13 ENCOUNTER — Encounter: Payer: Self-pay | Admitting: Internal Medicine

## 2015-10-13 VITALS — BP 164/81 | HR 71 | Temp 98.2°F | Resp 20 | Ht 72.0 in | Wt 234.4 lb

## 2015-10-13 DIAGNOSIS — D693 Immune thrombocytopenic purpura: Secondary | ICD-10-CM

## 2015-10-13 LAB — CBC WITH DIFFERENTIAL/PLATELET
BASO%: 0.7 % (ref 0.0–2.0)
Basophils Absolute: 0 10*3/uL (ref 0.0–0.1)
EOS%: 2.4 % (ref 0.0–7.0)
Eosinophils Absolute: 0.1 10*3/uL (ref 0.0–0.5)
HCT: 45.6 % (ref 38.4–49.9)
HEMOGLOBIN: 15.5 g/dL (ref 13.0–17.1)
LYMPH#: 2.4 10*3/uL (ref 0.9–3.3)
LYMPH%: 40.7 % (ref 14.0–49.0)
MCH: 32.6 pg (ref 27.2–33.4)
MCHC: 34 g/dL (ref 32.0–36.0)
MCV: 96 fL (ref 79.3–98.0)
MONO#: 0.5 10*3/uL (ref 0.1–0.9)
MONO%: 9 % (ref 0.0–14.0)
NEUT#: 2.8 10*3/uL (ref 1.5–6.5)
NEUT%: 47.2 % (ref 39.0–75.0)
Platelets: 87 10*3/uL — ABNORMAL LOW (ref 140–400)
RBC: 4.75 10*6/uL (ref 4.20–5.82)
RDW: 13.5 % (ref 11.0–14.6)
WBC: 5.9 10*3/uL (ref 4.0–10.3)
nRBC: 0 % (ref 0–0)

## 2015-10-13 LAB — LACTATE DEHYDROGENASE: LDH: 161 U/L (ref 125–245)

## 2015-10-13 NOTE — Progress Notes (Signed)
[place] Telephone:([phone]   [phone]  OFFICE PROGRESS NOTE  No primary care provider on file. No primary provider on file.  DIAGNOSIS: Persistent thrombocytopenia most likely immune mediated thrombocytopenic purpura in a patient with significant thyroid goiter.   PRIOR THERAPY: None.  CURRENT THERAPY: Observation.  INTERVAL HISTORY: Donald Keith 70 y.o. male returns to the clinic today for six-month follow-up visit. The patient is feeling fine today with no specific complaints. He denied having any significant fatigue or weakness. He denied having any bleeding, bruises or ecchymosis. He has no nausea or vomiting, no fever or chills. He denied having any significant chest pain, shortness of breath, cough or hemoptysis. There is no significant change in his medications. He recently retired. He is here today for evaluation with repeat CBC and LDH.  MEDICAL HISTORY: Past Medical History  Diagnosis Date  . Arthritis   . Multinodular goiter   . Thrombocytopenia (HCC)     ALLERGIES:  is allergic to codeine.  MEDICATIONS:  No current outpatient prescriptions on file.   No current facility-administered medications for this visit.    SURGICAL HISTORY:  Past Surgical History  Procedure Laterality Date  . Cervical fusion  2004    C5-7  . Shoulder arthroscopy  2000    right rotator cuff  . Colonoscopy    . Inguinal hernia repair Bilateral 2011    REVIEW OF SYSTEMS:  A comprehensive review of systems was negative.   PHYSICAL EXAMINATION: General appearance: alert, cooperative and no distress Head: Normocephalic, without obvious abnormality, atraumatic Neck: no adenopathy, no JVD, supple, symmetrical, trachea midline and thyroid not enlarged, symmetric, no tenderness/mass/nodules Lymph nodes: Cervical, supraclavicular, and axillary nodes normal. Resp: clear to auscultation bilaterally Back: symmetric, no curvature. ROM normal. No CVA  tenderness. Cardio: regular rate and rhythm, S1, S2 normal, no murmur, click, rub or gallop GI: soft, non-tender; bowel sounds normal; no masses,  no organomegaly Extremities: extremities normal, atraumatic, no cyanosis or edema  ECOG PERFORMANCE STATUS: 0 - Asymptomatic  Blood pressure 164/81, pulse 71, temperature 98.2 F (36.8 C), temperature source Oral, resp. rate 20, height 6' (1.829 m), weight 234 lb 6.4 oz (106.323 kg), SpO2 99 %.  LABORATORY DATA: Lab Results  Component Value Date   WBC 5.9 10/13/2015   HGB 15.5 10/13/2015   HCT 45.6 10/13/2015   MCV 96.0 10/13/2015   PLT 87 Large platelets present* 10/13/2015      Chemistry      Component Value Date/Time   NA 139 04/14/2015 1323   K 4.1 04/14/2015 1323   CO2 21* 04/14/2015 1323   BUN 13.9 04/14/2015 1323   CREATININE 1.1 04/14/2015 1323      Component Value Date/Time   CALCIUM 9.3 04/14/2015 1323   ALKPHOS 71 04/14/2015 1323   AST 16 04/14/2015 1323   ALT 15 04/14/2015 1323   BILITOT 0.68 04/14/2015 1323       RADIOGRAPHIC STUDIES: No results found.  ASSESSMENT AND PLAN: This is a very pleasant 70 years old white male with persistent thrombocytopenia likely ITP. The patient is currently on observation and there is decline in his platelets count compared to the last visit but still 87,000 with large platelets. I recommended for the patient to continue on observation for now. I would see him back for follow-up visit in 6 months with repeat CBC and LDH. He was advised to call immediately if he has any bleeding, bruises or ecchymosis. The patient  voices understanding of current disease status and treatment options and is in agreement with the current care plan.  All questions were answered. The patient knows to call the clinic with any problems, questions or concerns. We can certainly see the patient much sooner if necessary.   Disclaimer: This note was dictated with voice recognition software. Similar sounding  words can inadvertently be transcribed and may not be corrected upon review.

## 2015-10-13 NOTE — Telephone Encounter (Signed)
Gave and printed appt sched and avs for pt for DEc °

## 2016-01-13 DIAGNOSIS — Z23 Encounter for immunization: Secondary | ICD-10-CM | POA: Diagnosis not present

## 2016-01-13 DIAGNOSIS — Z125 Encounter for screening for malignant neoplasm of prostate: Secondary | ICD-10-CM | POA: Diagnosis not present

## 2016-01-13 DIAGNOSIS — Z0000 Encounter for general adult medical examination without abnormal findings: Secondary | ICD-10-CM | POA: Diagnosis not present

## 2016-01-13 DIAGNOSIS — Z1211 Encounter for screening for malignant neoplasm of colon: Secondary | ICD-10-CM | POA: Diagnosis not present

## 2016-01-13 DIAGNOSIS — M150 Primary generalized (osteo)arthritis: Secondary | ICD-10-CM | POA: Diagnosis not present

## 2016-01-13 DIAGNOSIS — D696 Thrombocytopenia, unspecified: Secondary | ICD-10-CM | POA: Diagnosis not present

## 2016-01-13 DIAGNOSIS — R030 Elevated blood-pressure reading, without diagnosis of hypertension: Secondary | ICD-10-CM | POA: Diagnosis not present

## 2016-01-13 DIAGNOSIS — E042 Nontoxic multinodular goiter: Secondary | ICD-10-CM | POA: Diagnosis not present

## 2016-01-28 DIAGNOSIS — Z1211 Encounter for screening for malignant neoplasm of colon: Secondary | ICD-10-CM | POA: Diagnosis not present

## 2016-02-26 DIAGNOSIS — H2513 Age-related nuclear cataract, bilateral: Secondary | ICD-10-CM | POA: Diagnosis not present

## 2016-04-12 ENCOUNTER — Encounter: Payer: Self-pay | Admitting: Internal Medicine

## 2016-04-12 ENCOUNTER — Other Ambulatory Visit (HOSPITAL_BASED_OUTPATIENT_CLINIC_OR_DEPARTMENT_OTHER): Payer: PPO

## 2016-04-12 ENCOUNTER — Telehealth: Payer: Self-pay | Admitting: Internal Medicine

## 2016-04-12 ENCOUNTER — Ambulatory Visit (HOSPITAL_BASED_OUTPATIENT_CLINIC_OR_DEPARTMENT_OTHER): Payer: PPO | Admitting: Internal Medicine

## 2016-04-12 VITALS — BP 177/85 | HR 70 | Temp 98.1°F | Resp 18 | Ht 72.0 in | Wt 239.0 lb

## 2016-04-12 DIAGNOSIS — I10 Essential (primary) hypertension: Secondary | ICD-10-CM | POA: Diagnosis not present

## 2016-04-12 DIAGNOSIS — D693 Immune thrombocytopenic purpura: Secondary | ICD-10-CM

## 2016-04-12 DIAGNOSIS — E049 Nontoxic goiter, unspecified: Secondary | ICD-10-CM | POA: Diagnosis not present

## 2016-04-12 HISTORY — DX: Essential (primary) hypertension: I10

## 2016-04-12 LAB — CBC WITH DIFFERENTIAL/PLATELET
BASO%: 1.4 % (ref 0.0–2.0)
Basophils Absolute: 0.1 10*3/uL (ref 0.0–0.1)
EOS%: 2.9 % (ref 0.0–7.0)
Eosinophils Absolute: 0.2 10*3/uL (ref 0.0–0.5)
HCT: 45.8 % (ref 38.4–49.9)
HGB: 15.4 g/dL (ref 13.0–17.1)
LYMPH%: 36.2 % (ref 14.0–49.0)
MCH: 31.9 pg (ref 27.2–33.4)
MCHC: 33.6 g/dL (ref 32.0–36.0)
MCV: 95 fL (ref 79.3–98.0)
MONO#: 0.7 10*3/uL (ref 0.1–0.9)
MONO%: 11.2 % (ref 0.0–14.0)
NEUT%: 48.3 % (ref 39.0–75.0)
NEUTROS ABS: 3.1 10*3/uL (ref 1.5–6.5)
Platelets: 100 10*3/uL — ABNORMAL LOW (ref 140–400)
RBC: 4.83 10*6/uL (ref 4.20–5.82)
RDW: 13.8 % (ref 11.0–14.6)
WBC: 6.3 10*3/uL (ref 4.0–10.3)
lymph#: 2.3 10*3/uL (ref 0.9–3.3)

## 2016-04-12 LAB — LACTATE DEHYDROGENASE: LDH: 174 U/L (ref 125–245)

## 2016-04-12 NOTE — Telephone Encounter (Signed)
Appointments scheduled per 04/12/16 los. A copy of the AVS report and appointment schedule was given to the patient, per 04/12/16 los. °

## 2016-04-12 NOTE — Progress Notes (Signed)
[place] Telephone:([phone]   Fax:(336) [phone]  OFFICE PROGRESS NOTE  No primary care provider on file. No primary provider on file.  DIAGNOSIS: Thrombocytopenia likely ITP.  PRIOR THERAPY: None  CURRENT THERAPY: Observation.  INTERVAL HISTORY: Donald Keith 70 y.o. male returns to the clinic today for six-month follow-up visit. The patient is doing fine today except for uncontrolled hypertension especially when he comes to the visit. He mentioned that his blood pressure is usually normal at home. He does not take any blood pressure medication. He was seen by his primary care physician recently and his blood pressure was good at that time. He will continue to have the enlarged goiter. He has no fever or chills. He has no nausea or vomiting. He denied having any significant chest pain, shortness of breath, cough or hemoptysis. No bleeding, bruises or ecchymosis. He has no weight loss or night sweats.  MEDICAL HISTORY: Past Medical History:  Diagnosis Date  . Arthritis   . Multinodular goiter   . Thrombocytopenia (HCC)     ALLERGIES:  is allergic to codeine.  MEDICATIONS:  No current outpatient prescriptions on file.   No current facility-administered medications for this visit.     SURGICAL HISTORY:  Past Surgical History:  Procedure Laterality Date  . CERVICAL FUSION  2004   C5-7  . COLONOSCOPY    . INGUINAL HERNIA REPAIR Bilateral 2011  . SHOULDER ARTHROSCOPY  2000   right rotator cuff    REVIEW OF SYSTEMS:  A comprehensive review of systems was negative.   PHYSICAL EXAMINATION: General appearance: alert, cooperative and no distress Head: Normocephalic, without obvious abnormality, atraumatic Neck: no adenopathy, no JVD, supple, symmetrical, trachea midline and Significantly enlarged thyroid gland especially the right thyroid lobe Lymph nodes: Cervical, supraclavicular, and axillary nodes normal. Resp: clear to auscultation  bilaterally Back: symmetric, no curvature. ROM normal. No CVA tenderness. Cardio: regular rate and rhythm, S1, S2 normal, no murmur, click, rub or gallop GI: soft, non-tender; bowel sounds normal; no masses,  no organomegaly Extremities: extremities normal, atraumatic, no cyanosis or edema  ECOG PERFORMANCE STATUS: 0 - Asymptomatic  Blood pressure (!) 177/85, pulse 70, temperature 98.1 F (36.7 C), temperature source Oral, resp. rate 18, height 6' (1.829 m), weight 239 lb (108.4 kg), SpO2 97 %.  LABORATORY DATA: Lab Results  Component Value Date   WBC 6.3 04/12/2016   HGB 15.4 04/12/2016   HCT 45.8 04/12/2016   MCV 95.0 04/12/2016   PLT 100 (L) 04/12/2016      Chemistry      Component Value Date/Time   NA 139 04/14/2015 1323   K 4.1 04/14/2015 1323   CO2 21 (L) 04/14/2015 1323   BUN 13.9 04/14/2015 1323   CREATININE 1.1 04/14/2015 1323      Component Value Date/Time   CALCIUM 9.3 04/14/2015 1323   ALKPHOS 71 04/14/2015 1323   AST 16 04/14/2015 1323   ALT 15 04/14/2015 1323   BILITOT 0.68 04/14/2015 1323       RADIOGRAPHIC STUDIES: No results found.  ASSESSMENT AND PLAN: This is a very pleasant 70 years old white male with mild thrombocytopenia likely ITP in a patient with thyroid goiter. He is feeling fine today with no specific complaints. He has no bleeding issues His platelets count are 100,000 today. I recommended for the patient to continue on observation with repeat CBC and LDH in 6 months. I will see him back for follow-up visit at  that time. For hypertension, I strongly advised the patient to monitor his blood pressure closely at home and stayed elevated to consult with his primary care physician. He was advised to call immediately if he has any concerning symptoms in the interval. The patient voices understanding of current disease status and treatment options and is in agreement with the current care plan.  All questions were answered. The patient knows  to call the clinic with any problems, questions or concerns. We can certainly see the patient much sooner if necessary.  Disclaimer: This note was dictated with voice recognition software. Similar sounding words can inadvertently be transcribed and may not be corrected upon review.

## 2016-04-26 DIAGNOSIS — H2513 Age-related nuclear cataract, bilateral: Secondary | ICD-10-CM | POA: Diagnosis not present

## 2016-04-26 DIAGNOSIS — H2512 Age-related nuclear cataract, left eye: Secondary | ICD-10-CM | POA: Diagnosis not present

## 2016-06-01 DIAGNOSIS — H2512 Age-related nuclear cataract, left eye: Secondary | ICD-10-CM | POA: Diagnosis not present

## 2016-06-01 DIAGNOSIS — Z961 Presence of intraocular lens: Secondary | ICD-10-CM | POA: Diagnosis not present

## 2016-06-02 DIAGNOSIS — H2511 Age-related nuclear cataract, right eye: Secondary | ICD-10-CM | POA: Diagnosis not present

## 2016-06-08 DIAGNOSIS — H5211 Myopia, right eye: Secondary | ICD-10-CM | POA: Diagnosis not present

## 2016-06-08 DIAGNOSIS — Z961 Presence of intraocular lens: Secondary | ICD-10-CM | POA: Diagnosis not present

## 2016-06-08 DIAGNOSIS — H2511 Age-related nuclear cataract, right eye: Secondary | ICD-10-CM | POA: Diagnosis not present

## 2016-07-13 DIAGNOSIS — E042 Nontoxic multinodular goiter: Secondary | ICD-10-CM | POA: Diagnosis not present

## 2016-09-01 DIAGNOSIS — Z961 Presence of intraocular lens: Secondary | ICD-10-CM | POA: Diagnosis not present

## 2016-09-01 DIAGNOSIS — H26492 Other secondary cataract, left eye: Secondary | ICD-10-CM | POA: Diagnosis not present

## 2016-09-22 DIAGNOSIS — Z961 Presence of intraocular lens: Secondary | ICD-10-CM | POA: Diagnosis not present

## 2016-09-22 DIAGNOSIS — H26491 Other secondary cataract, right eye: Secondary | ICD-10-CM | POA: Diagnosis not present

## 2016-10-11 ENCOUNTER — Telehealth: Payer: Self-pay | Admitting: Internal Medicine

## 2016-10-11 ENCOUNTER — Other Ambulatory Visit (HOSPITAL_BASED_OUTPATIENT_CLINIC_OR_DEPARTMENT_OTHER): Payer: PPO

## 2016-10-11 ENCOUNTER — Ambulatory Visit (HOSPITAL_BASED_OUTPATIENT_CLINIC_OR_DEPARTMENT_OTHER): Payer: PPO | Admitting: Internal Medicine

## 2016-10-11 ENCOUNTER — Encounter: Payer: Self-pay | Admitting: Internal Medicine

## 2016-10-11 VITALS — BP 162/83 | HR 67 | Temp 98.7°F | Resp 18 | Ht 72.0 in | Wt 242.7 lb

## 2016-10-11 DIAGNOSIS — D693 Immune thrombocytopenic purpura: Secondary | ICD-10-CM | POA: Diagnosis not present

## 2016-10-11 DIAGNOSIS — I10 Essential (primary) hypertension: Secondary | ICD-10-CM

## 2016-10-11 DIAGNOSIS — E049 Nontoxic goiter, unspecified: Secondary | ICD-10-CM

## 2016-10-11 LAB — CBC WITH DIFFERENTIAL/PLATELET
BASO%: 0.9 % (ref 0.0–2.0)
Basophils Absolute: 0.1 10*3/uL (ref 0.0–0.1)
EOS ABS: 0.2 10*3/uL (ref 0.0–0.5)
EOS%: 2.8 % (ref 0.0–7.0)
HEMATOCRIT: 46.4 % (ref 38.4–49.9)
HEMOGLOBIN: 16 g/dL (ref 13.0–17.1)
LYMPH#: 2.3 10*3/uL (ref 0.9–3.3)
LYMPH%: 40.4 % (ref 14.0–49.0)
MCH: 33 pg (ref 27.2–33.4)
MCHC: 34.4 g/dL (ref 32.0–36.0)
MCV: 95.8 fL (ref 79.3–98.0)
MONO#: 0.6 10*3/uL (ref 0.1–0.9)
MONO%: 9.6 % (ref 0.0–14.0)
NEUT%: 46.3 % (ref 39.0–75.0)
NEUTROS ABS: 2.7 10*3/uL (ref 1.5–6.5)
Platelets: 88 10*3/uL — ABNORMAL LOW (ref 140–400)
RBC: 4.84 10*6/uL (ref 4.20–5.82)
RDW: 13.5 % (ref 11.0–14.6)
WBC: 5.8 10*3/uL (ref 4.0–10.3)

## 2016-10-11 LAB — LACTATE DEHYDROGENASE: LDH: 162 U/L (ref 125–245)

## 2016-10-11 NOTE — Telephone Encounter (Signed)
Per 6/4 LOS - no follow up visit is needed.

## 2016-10-11 NOTE — Progress Notes (Signed)
[place] [phone]   Fax:(336) [phone]  OFFICE PROGRESS [place] [id number]  DIAGNOSIS: Thrombocytopenia likely ITP.  PRIOR THERAPY: None  CURRENT THERAPY: Observation.  INTERVAL HISTORY: Donald Keith 71 y.o. male returns to the clinic today for 6 months follow-up visit. The patient is feeling fine today with no specific complaints. He denied having any chest pain, shortness of breath, cough or hemoptysis. He has no bleeding, bruises or ecchymosis. He denied having any weight loss or night sweats. He has no nausea, vomiting, diarrhea or constipation. He says today for evaluation and repeat blood work.  MEDICAL HISTORY: Past Medical History:  Diagnosis Date  . Arthritis   . Hypertension 04/12/2016  . Multinodular goiter   . Thrombocytopenia (HCC)     ALLERGIES:  is allergic to codeine.  MEDICATIONS:  No current outpatient prescriptions on file.   No current facility-administered medications for this visit.     SURGICAL HISTORY:  Past Surgical History:  Procedure Laterality Date  . CERVICAL FUSION  2004   C5-7  . COLONOSCOPY    . INGUINAL HERNIA REPAIR Bilateral 2011  . SHOULDER ARTHROSCOPY  2000   right rotator cuff    REVIEW OF SYSTEMS:  A comprehensive review of systems was negative.   PHYSICAL EXAMINATION: General appearance: alert, cooperative and no distress Head: Normocephalic, without obvious abnormality, atraumatic Neck: no adenopathy, no JVD, supple, symmetrical, trachea midline and Significantly enlarged thyroid gland especially the right thyroid lobe Lymph nodes: Cervical, supraclavicular, and axillary nodes normal. Resp: clear to auscultation bilaterally Back: symmetric, no curvature. ROM normal. No CVA tenderness. Cardio: regular rate and rhythm, S1, S2 normal, no murmur, click, rub or gallop GI: soft, non-tender; bowel sounds normal; no masses,  no  organomegaly Extremities: extremities normal, atraumatic, no cyanosis or edema  ECOG PERFORMANCE STATUS: 0 - Asymptomatic  Blood pressure (!) 162/83, pulse 67, temperature 98.7 F (37.1 C), temperature source Oral, resp. rate 18, height 6' (1.829 m), weight 242 lb 11.2 oz (110.1 kg), SpO2 98 %.  LABORATORY DATA: Lab Results  Component Value Date   WBC 5.8 10/11/2016   HGB 16.0 10/11/2016   HCT 46.4 10/11/2016   MCV 95.8 10/11/2016   PLT 88 (L) 10/11/2016      Chemistry      Component Value Date/Time   NA 139 04/14/2015 1323   K 4.1 04/14/2015 1323   CO2 21 (L) 04/14/2015 1323   BUN 13.9 04/14/2015 1323   CREATININE 1.1 04/14/2015 1323      Component Value Date/Time   CALCIUM 9.3 04/14/2015 1323   ALKPHOS 71 04/14/2015 1323   AST 16 04/14/2015 1323   ALT 15 04/14/2015 1323   BILITOT 0.68 04/14/2015 1323       RADIOGRAPHIC STUDIES: No results found.  ASSESSMENT AND PLAN:  This is a very pleasant 71 years old white male with idiopathic thrombocytopenic purpura as well as thyroid goiter. He is currently on observation and his platelets count today are 88,000. The patient is asymptomatic and denied having any bleeding, bruises or ecchymosis. I recommended for him to continue on observation. He would like to continue his routine follow-up visit by his primary care physician at this point. I would be happy to see him in the future on as-needed basis. For the hypertension, he mentioned that his blood pressure at home is within the normal range. He will continue to monitor and  report to his primary care physician if needed. He was advised to call immediately if he has any concerning symptoms. The patient voices understanding of current disease status and treatment options and is in agreement with the current care plan.  All questions were answered. The patient knows to call the clinic with any problems, questions or concerns. We can certainly see the patient much sooner if  necessary.  Disclaimer: This note was dictated with voice recognition software. Similar sounding words can inadvertently be transcribed and may not be corrected upon review.

## 2016-11-05 DIAGNOSIS — H43812 Vitreous degeneration, left eye: Secondary | ICD-10-CM | POA: Diagnosis not present

## 2017-01-13 DIAGNOSIS — R030 Elevated blood-pressure reading, without diagnosis of hypertension: Secondary | ICD-10-CM | POA: Diagnosis not present

## 2017-01-13 DIAGNOSIS — E042 Nontoxic multinodular goiter: Secondary | ICD-10-CM | POA: Diagnosis not present

## 2017-01-13 DIAGNOSIS — D696 Thrombocytopenia, unspecified: Secondary | ICD-10-CM | POA: Diagnosis not present

## 2017-01-13 DIAGNOSIS — Z125 Encounter for screening for malignant neoplasm of prostate: Secondary | ICD-10-CM | POA: Diagnosis not present

## 2017-01-13 DIAGNOSIS — Z0000 Encounter for general adult medical examination without abnormal findings: Secondary | ICD-10-CM | POA: Diagnosis not present

## 2017-01-13 DIAGNOSIS — D225 Melanocytic nevi of trunk: Secondary | ICD-10-CM | POA: Diagnosis not present

## 2017-01-13 DIAGNOSIS — Z1211 Encounter for screening for malignant neoplasm of colon: Secondary | ICD-10-CM | POA: Diagnosis not present

## 2017-01-13 DIAGNOSIS — Z23 Encounter for immunization: Secondary | ICD-10-CM | POA: Diagnosis not present

## 2017-01-24 DIAGNOSIS — R739 Hyperglycemia, unspecified: Secondary | ICD-10-CM | POA: Diagnosis not present

## 2017-04-27 DIAGNOSIS — Z1211 Encounter for screening for malignant neoplasm of colon: Secondary | ICD-10-CM | POA: Diagnosis not present

## 2017-09-28 DIAGNOSIS — Z9841 Cataract extraction status, right eye: Secondary | ICD-10-CM | POA: Diagnosis not present

## 2017-09-28 DIAGNOSIS — H5203 Hypermetropia, bilateral: Secondary | ICD-10-CM | POA: Diagnosis not present

## 2017-09-28 DIAGNOSIS — H52222 Regular astigmatism, left eye: Secondary | ICD-10-CM | POA: Diagnosis not present

## 2017-09-28 DIAGNOSIS — Z9842 Cataract extraction status, left eye: Secondary | ICD-10-CM | POA: Diagnosis not present

## 2017-09-28 DIAGNOSIS — H43312 Vitreous membranes and strands, left eye: Secondary | ICD-10-CM | POA: Diagnosis not present

## 2018-01-16 DIAGNOSIS — Z23 Encounter for immunization: Secondary | ICD-10-CM | POA: Diagnosis not present

## 2018-01-16 DIAGNOSIS — Z125 Encounter for screening for malignant neoplasm of prostate: Secondary | ICD-10-CM | POA: Diagnosis not present

## 2018-01-16 DIAGNOSIS — M150 Primary generalized (osteo)arthritis: Secondary | ICD-10-CM | POA: Diagnosis not present

## 2018-01-16 DIAGNOSIS — R030 Elevated blood-pressure reading, without diagnosis of hypertension: Secondary | ICD-10-CM | POA: Diagnosis not present

## 2018-01-16 DIAGNOSIS — Z13220 Encounter for screening for lipoid disorders: Secondary | ICD-10-CM | POA: Diagnosis not present

## 2018-01-16 DIAGNOSIS — Z0000 Encounter for general adult medical examination without abnormal findings: Secondary | ICD-10-CM | POA: Diagnosis not present

## 2018-01-16 DIAGNOSIS — E042 Nontoxic multinodular goiter: Secondary | ICD-10-CM | POA: Diagnosis not present

## 2018-06-02 DIAGNOSIS — R030 Elevated blood-pressure reading, without diagnosis of hypertension: Secondary | ICD-10-CM | POA: Diagnosis not present

## 2018-06-02 DIAGNOSIS — L989 Disorder of the skin and subcutaneous tissue, unspecified: Secondary | ICD-10-CM | POA: Diagnosis not present

## 2018-06-09 DIAGNOSIS — L570 Actinic keratosis: Secondary | ICD-10-CM | POA: Diagnosis not present

## 2018-06-09 DIAGNOSIS — Z85828 Personal history of other malignant neoplasm of skin: Secondary | ICD-10-CM | POA: Diagnosis not present

## 2018-06-09 DIAGNOSIS — D0461 Carcinoma in situ of skin of right upper limb, including shoulder: Secondary | ICD-10-CM | POA: Diagnosis not present

## 2018-06-09 DIAGNOSIS — C44622 Squamous cell carcinoma of skin of right upper limb, including shoulder: Secondary | ICD-10-CM | POA: Diagnosis not present

## 2019-01-18 DIAGNOSIS — R030 Elevated blood-pressure reading, without diagnosis of hypertension: Secondary | ICD-10-CM | POA: Diagnosis not present

## 2019-01-18 DIAGNOSIS — Z0000 Encounter for general adult medical examination without abnormal findings: Secondary | ICD-10-CM | POA: Diagnosis not present

## 2019-01-18 DIAGNOSIS — R7303 Prediabetes: Secondary | ICD-10-CM | POA: Diagnosis not present

## 2019-01-18 DIAGNOSIS — D696 Thrombocytopenia, unspecified: Secondary | ICD-10-CM | POA: Diagnosis not present

## 2019-01-18 DIAGNOSIS — Z1211 Encounter for screening for malignant neoplasm of colon: Secondary | ICD-10-CM | POA: Diagnosis not present

## 2019-01-18 DIAGNOSIS — E042 Nontoxic multinodular goiter: Secondary | ICD-10-CM | POA: Diagnosis not present

## 2019-01-18 DIAGNOSIS — M150 Primary generalized (osteo)arthritis: Secondary | ICD-10-CM | POA: Diagnosis not present

## 2019-01-18 DIAGNOSIS — I499 Cardiac arrhythmia, unspecified: Secondary | ICD-10-CM | POA: Diagnosis not present

## 2019-01-18 DIAGNOSIS — Z23 Encounter for immunization: Secondary | ICD-10-CM | POA: Diagnosis not present

## 2019-01-18 DIAGNOSIS — Z125 Encounter for screening for malignant neoplasm of prostate: Secondary | ICD-10-CM | POA: Diagnosis not present

## 2019-03-26 DIAGNOSIS — Z9841 Cataract extraction status, right eye: Secondary | ICD-10-CM | POA: Diagnosis not present

## 2019-03-26 DIAGNOSIS — H5203 Hypermetropia, bilateral: Secondary | ICD-10-CM | POA: Diagnosis not present

## 2019-03-26 DIAGNOSIS — Z9842 Cataract extraction status, left eye: Secondary | ICD-10-CM | POA: Diagnosis not present

## 2019-03-26 DIAGNOSIS — H52223 Regular astigmatism, bilateral: Secondary | ICD-10-CM | POA: Diagnosis not present

## 2019-07-12 ENCOUNTER — Ambulatory Visit: Payer: PPO | Attending: Internal Medicine

## 2019-07-12 DIAGNOSIS — Z23 Encounter for immunization: Secondary | ICD-10-CM | POA: Insufficient documentation

## 2019-07-12 NOTE — Progress Notes (Signed)
   Covid-19 Vaccination Clinic  Name:  LAMARI MOSCONE    MRN: [id number] DOB: 1945-12-03  07/12/2019  Mr. Estabrook was observed post Covid-19 immunization for 15 minutes without incident. He was provided with Vaccine Information Sheet and instruction to access the V-Safe system.   Mr. Amory was instructed to call 911 with any severe reactions post vaccine: Marland Kitchen Difficulty breathing  . Swelling of face and throat  . A fast heartbeat  . A bad rash all over body  . Dizziness and weakness   Immunizations Administered    Name Date Dose VIS Date Route   Pfizer COVID-19 Vaccine 07/12/2019  8:36 AM 0.3 mL 04/20/2019 Intramuscular   Manufacturer: [place]   Lot: [id number]   [place]: [id number]

## 2019-08-02 ENCOUNTER — Ambulatory Visit: Payer: PPO | Attending: Internal Medicine

## 2019-08-02 DIAGNOSIS — Z23 Encounter for immunization: Secondary | ICD-10-CM

## 2019-08-02 NOTE — Progress Notes (Signed)
   Covid-19 Vaccination Clinic  Name:  Donald Keith    MRN: [id number] DOB: 08-15-1945  08/02/2019  Mr. Frieling was observed post Covid-19 immunization for 15 minutes without incident. He was provided with Vaccine Information Sheet and instruction to access the V-Safe system.   Mr. Sponaugle was instructed to call 911 with any severe reactions post vaccine: Marland Kitchen Difficulty breathing  . Swelling of face and throat  . A fast heartbeat  . A bad rash all over body  . Dizziness and weakness   Immunizations Administered    Name Date Dose VIS Date Route   Pfizer COVID-19 Vaccine 08/02/2019  8:27 AM 0.3 mL 04/20/2019 Intramuscular   Manufacturer: [place]   Lot: [id number]   [place]: [id number]

## 2020-01-21 DIAGNOSIS — E042 Nontoxic multinodular goiter: Secondary | ICD-10-CM | POA: Diagnosis not present

## 2020-01-21 DIAGNOSIS — Z1211 Encounter for screening for malignant neoplasm of colon: Secondary | ICD-10-CM | POA: Diagnosis not present

## 2020-01-21 DIAGNOSIS — Z0000 Encounter for general adult medical examination without abnormal findings: Secondary | ICD-10-CM | POA: Diagnosis not present

## 2020-01-21 DIAGNOSIS — M150 Primary generalized (osteo)arthritis: Secondary | ICD-10-CM | POA: Diagnosis not present

## 2020-01-21 DIAGNOSIS — D696 Thrombocytopenia, unspecified: Secondary | ICD-10-CM | POA: Diagnosis not present

## 2020-01-21 DIAGNOSIS — Z125 Encounter for screening for malignant neoplasm of prostate: Secondary | ICD-10-CM | POA: Diagnosis not present

## 2020-01-21 DIAGNOSIS — Z23 Encounter for immunization: Secondary | ICD-10-CM | POA: Diagnosis not present

## 2020-11-27 DIAGNOSIS — R42 Dizziness and giddiness: Secondary | ICD-10-CM | POA: Diagnosis not present

## 2020-11-27 DIAGNOSIS — I10 Essential (primary) hypertension: Secondary | ICD-10-CM | POA: Diagnosis not present

## 2021-01-26 DIAGNOSIS — D696 Thrombocytopenia, unspecified: Secondary | ICD-10-CM | POA: Diagnosis not present

## 2021-01-26 DIAGNOSIS — I10 Essential (primary) hypertension: Secondary | ICD-10-CM | POA: Diagnosis not present

## 2021-01-26 DIAGNOSIS — E042 Nontoxic multinodular goiter: Secondary | ICD-10-CM | POA: Diagnosis not present

## 2021-01-26 DIAGNOSIS — Z1211 Encounter for screening for malignant neoplasm of colon: Secondary | ICD-10-CM | POA: Diagnosis not present

## 2021-01-26 DIAGNOSIS — Z23 Encounter for immunization: Secondary | ICD-10-CM | POA: Diagnosis not present

## 2021-01-26 DIAGNOSIS — R7303 Prediabetes: Secondary | ICD-10-CM | POA: Diagnosis not present

## 2021-01-26 DIAGNOSIS — Z125 Encounter for screening for malignant neoplasm of prostate: Secondary | ICD-10-CM | POA: Diagnosis not present

## 2021-01-26 DIAGNOSIS — M150 Primary generalized (osteo)arthritis: Secondary | ICD-10-CM | POA: Diagnosis not present

## 2021-01-26 DIAGNOSIS — Z0000 Encounter for general adult medical examination without abnormal findings: Secondary | ICD-10-CM | POA: Diagnosis not present

## 2021-02-18 ENCOUNTER — Other Ambulatory Visit: Payer: Self-pay

## 2021-02-18 ENCOUNTER — Ambulatory Visit (INDEPENDENT_AMBULATORY_CARE_PROVIDER_SITE_OTHER): Payer: PPO | Admitting: Interventional Cardiology

## 2021-02-18 ENCOUNTER — Encounter: Payer: Self-pay | Admitting: Interventional Cardiology

## 2021-02-18 VITALS — BP 144/88 | HR 79 | Ht 70.0 in | Wt 234.0 lb

## 2021-02-18 DIAGNOSIS — Z8249 Family history of ischemic heart disease and other diseases of the circulatory system: Secondary | ICD-10-CM

## 2021-02-18 DIAGNOSIS — R42 Dizziness and giddiness: Secondary | ICD-10-CM

## 2021-02-18 DIAGNOSIS — I10 Essential (primary) hypertension: Secondary | ICD-10-CM

## 2021-02-18 NOTE — Progress Notes (Signed)
Cardiology Office Note   Date:  02/18/2021   ID:  Donald Keith, DOB 1945/09/09, MRN [id number]  PCP:  Shirline Frees, MD    No chief complaint on file.  HTN  Wt Readings from Last 3 Encounters:  02/18/21 234 lb (106.1 kg)  10/11/16 242 lb 11.2 oz (110.1 kg)  04/12/16 239 lb (108.4 kg)       History of Present Illness: Donald Keith is a 75 y.o. male who is being seen today for the evaluation of lightheadedness at the request of Shirline Frees, MD.   Amlodipine was started in July 2022 for HTN.  Home readings were in the 170s.  Started at 2.5 mg and then increased to 5 mg a few weeks ago.At home, BP are now in the 130s/80s.    Had lightheadedness with higher blood pressure readings.  This has resolved since the blood pressure is better.  Specifically, there was one episode in July 2022 of lighthededness, but he feels he was dehydrated.  He was drinking more sodas back then.  He is drinking more water now.   Climbs ladders, cleaning gutters, yard work is most strenuous activity.  Knee pain limits walking.  Does walk stairs in his house. No CP or SHOB.  Uses total gym at home. No symptoms with that activity.   Both parents had MI in their later years. Sister passed away at 45 with diabetes.   No smoking history.  Eats a lot of meat as part of regular diet, some processed.    Denies : Chest pain. Dizziness. Leg edema. Nitroglycerin use. Orthopnea. Palpitations. Paroxysmal nocturnal dyspnea. Shortness of breath. Syncope.     Past Medical History:  Diagnosis Date   Arthritis    Hypertension 04/12/2016   Multinodular goiter    Thrombocytopenia [facility])     Past Surgical History:  Procedure Laterality Date   CERVICAL FUSION  2004   C5-7   COLONOSCOPY     INGUINAL HERNIA REPAIR Bilateral 2011   SHOULDER ARTHROSCOPY  2000   right rotator cuff     Current Outpatient Medications  Medication Sig Dispense Refill   amLODipine (NORVASC) 2.5 MG tablet Take 2.5 mg by  mouth daily.     Multiple Vitamins-Minerals (MULTIVITAMIN WITH MINERALS) tablet Take 1 tablet by mouth daily.     No current facility-administered medications for this visit.    Allergies:   Codeine    Social History:  The patient  reports that he has never smoked. He has never used smokeless tobacco. He reports that he does not drink alcohol and does not use drugs.   Family History:  The patient's family history includes Diabetes in his father, maternal grandfather, maternal grandmother, mother, and sister; Heart attack in his mother; Heart disease in his father; Hyperlipidemia in his sister; Hypertension in his mother; Skin cancer in his father.    ROS:  Please see the history of present illness.   Otherwise, review of systems are positive for increased BP.   All other systems are reviewed and negative.    PHYSICAL EXAM: VS:  BP (!) 144/88   Pulse 79   Ht 5\' 10"  (1.778 m)   Wt 234 lb (106.1 kg)   SpO2 98%   BMI 33.58 kg/m  , BMI Body mass index is 33.58 kg/m. GEN: Well nourished, well developed, in no acute distress HEENT: normal Neck: no JVD, carotid bruits, or masses Cardiac: RRR; no murmurs, rubs, or gallops,no edema  Respiratory:  clear to auscultation bilaterally, normal work of breathing GI: soft, nontender, nondistended, + BS MS: no deformity or atrophy; spider veins noted on legs.  Skin: warm and dry, no rash Neuro:  Strength and sensation are intact Psych: euthymic mood, full affect   EKG:   The ekg ordered today demonstrates normal sinus rhythm, left axis deviation, PVC, no ST segment changes   Recent Labs: No results found for requested labs within last 8760 hours.   Lipid Panel No results found for: CHOL, TRIG, HDL, CHOLHDL, VLDL, LDLCALC, LDLDIRECT   Other studies Reviewed: Additional studies/ records that were reviewed today with results demonstrating:   Labs reviewed.   ASSESSMENT AND PLAN:  HTN: Better controlled.  Continue amlodipine.  If blood  pressure increases, can increase the dose of amlodipine or would consider an ACE inhibitor.  Lightheadedness has improved and was likely related to high blood pressure readings in the setting of dehydration.   No signs of CHF.  No significant palpitations. Family h/o CAD: Plan for calcium scoring CT.  Consider statin based on calcium score.  LDL 102 at recent check.  Dietary recommendations below.    Lightheadedness: resolved.   Whole food, plant-based diet.  Avoid processed foods.   Current medicines are reviewed at length with the patient today.  The patient concerns regarding his medicines were addressed.  The following changes have been made:  No change  Labs/ tests ordered today include: CT calcium scoring No orders of the defined types were placed in this encounter.   Recommend 150 minutes/week of aerobic exercise Low fat, low carb, high fiber diet recommended  Disposition:   FU in based on CT results   Signed, Larae Grooms, MD  02/18/2021 9:51 AM    [facility] [place], [place], [place]  [address] Phone: [phone]; Fax: [phone]

## 2021-02-18 NOTE — Patient Instructions (Signed)
Medication Instructions:  Your physician recommends that you continue on your current medications as directed. Please refer to the Current Medication list given to you today.  *If you need a refill on your cardiac medications before your next appointment, please call your pharmacy*   Lab Work: none If you have labs (blood work) drawn today and your tests are completely normal, you will receive your results only by: [place] (if you have MyChart) OR A paper copy in the mail If you have any lab test that is abnormal or we need to change your treatment, we will call you to review the results.   Testing/Procedures: Dr Irish Lack recommends you have a Calcium Score CT Scan   Follow-Up: At [facility], you and your health needs are our priority.  As part of our continuing mission to provide you with exceptional heart care, we have created designated Provider Care Teams.  These Care Teams include your primary Cardiologist (physician) and Advanced Practice Providers (APPs -  Physician Assistants and Nurse Practitioners) who all work together to provide you with the care you need, when you need it.  We recommend signing up for the patient portal called "MyChart".  Sign up information is provided on this After Visit Summary.  MyChart is used to connect with patients for Virtual Visits (Telemedicine).  Patients are able to view lab/test results, encounter notes, upcoming appointments, etc.  Non-urgent messages can be sent to your provider as well.   To learn more about what you can do with MyChart, go to [email or web address].    Your next appointment:   Based on test results  The format for your next appointment:   In Person  Provider:   You may see Larae Grooms, MD or one of the following Advanced Practice Providers on your designated Care Team:   Melina Copa, PA-C Ermalinda Barrios, PA-C   Other Instructions

## 2021-03-10 ENCOUNTER — Ambulatory Visit
Admission: RE | Admit: 2021-03-10 | Discharge: 2021-03-10 | Disposition: A | Discharge status: Home / Self Care | Payer: Self-pay | Source: Ambulatory Visit | Attending: Interventional Cardiology | Admitting: Interventional Cardiology

## 2021-03-10 ENCOUNTER — Other Ambulatory Visit: Payer: Self-pay

## 2021-03-10 DIAGNOSIS — I10 Essential (primary) hypertension: Secondary | ICD-10-CM

## 2021-03-10 DIAGNOSIS — Z8249 Family history of ischemic heart disease and other diseases of the circulatory system: Secondary | ICD-10-CM

## 2021-03-11 ENCOUNTER — Telehealth: Payer: Self-pay | Admitting: *Deleted

## 2021-03-11 DIAGNOSIS — R931 Abnormal findings on diagnostic imaging of heart and coronary circulation: Secondary | ICD-10-CM

## 2021-03-11 DIAGNOSIS — Z79899 Other long term (current) drug therapy: Secondary | ICD-10-CM

## 2021-03-11 MED ORDER — ROSUVASTATIN CALCIUM 10 MG PO TABS: 10.0000 mg | ORAL_TABLET | Freq: Every day | ORAL | 3 refills | Status: DC

## 2021-03-11 NOTE — Telephone Encounter (Signed)
The patient has been notified of the result and verbalized understanding.  All questions (if any) were answered. Darrell Jewel, RN 03/11/2021 10:24 AM    Rosuvastatin 10 mg ordered. Labs ordered and scheduled.

## 2021-03-11 NOTE — Telephone Encounter (Signed)
-----   Message from Jettie Booze, MD sent at 03/10/2021  6:18 PM EDT ----- Calcium score 144. WOuld recommend rosuvastatin 10 mg daily.  Check lipids and liver test in 3 months. COntinue healthy diet and regular exercise.

## 2021-03-11 NOTE — Telephone Encounter (Signed)
error 

## 2021-06-11 ENCOUNTER — Other Ambulatory Visit: Payer: Medicare HMO

## 2021-06-11 ENCOUNTER — Other Ambulatory Visit: Payer: Self-pay

## 2021-06-11 DIAGNOSIS — R931 Abnormal findings on diagnostic imaging of heart and coronary circulation: Secondary | ICD-10-CM | POA: Diagnosis not present

## 2021-06-11 DIAGNOSIS — Z79899 Other long term (current) drug therapy: Secondary | ICD-10-CM | POA: Diagnosis not present

## 2021-06-11 LAB — HEPATIC FUNCTION PANEL
ALT: 18 IU/L (ref 0–44)
AST: 18 IU/L (ref 0–40)
Albumin: 4.4 g/dL (ref 3.7–4.7)
Alkaline Phosphatase: 80 IU/L (ref 44–121)
Bilirubin Total: 0.5 mg/dL (ref 0.0–1.2)
Bilirubin, Direct: 0.11 mg/dL (ref 0.00–0.40)
Total Protein: 7.2 g/dL (ref 6.0–8.5)

## 2021-06-11 LAB — LIPID PANEL
Chol/HDL Ratio: 2.8 ratio (ref 0.0–5.0)
Cholesterol, Total: 108 mg/dL (ref 100–199)
HDL: 38 mg/dL — ABNORMAL LOW (ref >39)
LDL Chol Calc (NIH): 49 mg/dL (ref 0–99)
Triglycerides: 115 mg/dL (ref 0–149)
VLDL Cholesterol Cal: 21 mg/dL (ref 5–40)

## 2021-07-27 DIAGNOSIS — R7303 Prediabetes: Secondary | ICD-10-CM | POA: Diagnosis not present

## 2021-07-27 DIAGNOSIS — M150 Primary generalized (osteo)arthritis: Secondary | ICD-10-CM | POA: Diagnosis not present

## 2021-07-27 DIAGNOSIS — I2510 Atherosclerotic heart disease of native coronary artery without angina pectoris: Secondary | ICD-10-CM | POA: Diagnosis not present

## 2021-07-27 DIAGNOSIS — I10 Essential (primary) hypertension: Secondary | ICD-10-CM | POA: Diagnosis not present

## 2021-07-27 DIAGNOSIS — E042 Nontoxic multinodular goiter: Secondary | ICD-10-CM | POA: Diagnosis not present

## 2021-12-10 DIAGNOSIS — D485 Neoplasm of uncertain behavior of skin: Secondary | ICD-10-CM | POA: Diagnosis not present

## 2021-12-10 DIAGNOSIS — C44719 Basal cell carcinoma of skin of left lower limb, including hip: Secondary | ICD-10-CM | POA: Diagnosis not present

## 2021-12-10 DIAGNOSIS — C44321 Squamous cell carcinoma of skin of nose: Secondary | ICD-10-CM | POA: Diagnosis not present

## 2022-01-02 ENCOUNTER — Other Ambulatory Visit: Payer: Self-pay

## 2022-01-02 ENCOUNTER — Ambulatory Visit (HOSPITAL_COMMUNITY)
Admission: EM | Admit: 2022-01-02 | Discharge: 2022-01-02 | Disposition: A | Discharge status: Home / Self Care | Payer: Medicare HMO | Attending: Family Medicine | Admitting: Family Medicine

## 2022-01-02 ENCOUNTER — Encounter (HOSPITAL_COMMUNITY): Payer: Self-pay | Admitting: *Deleted

## 2022-01-02 DIAGNOSIS — R42 Dizziness and giddiness: Secondary | ICD-10-CM | POA: Insufficient documentation

## 2022-01-02 LAB — CBC
HCT: 47.9 % (ref 39.0–52.0)
Hemoglobin: 16.5 g/dL (ref 13.0–17.0)
MCH: 33.5 pg (ref 26.0–34.0)
MCHC: 34.4 g/dL (ref 30.0–36.0)
MCV: 97.4 fL (ref 80.0–100.0)
Platelets: 119 10*3/uL — ABNORMAL LOW (ref 150–400)
RBC: 4.92 MIL/uL (ref 4.22–5.81)
RDW: 13.1 % (ref 11.5–15.5)
WBC: 9.3 10*3/uL (ref 4.0–10.5)
nRBC: 0 % (ref 0.0–0.2)

## 2022-01-02 LAB — BASIC METABOLIC PANEL
Anion gap: 9 (ref 5–15)
BUN: 12 mg/dL (ref 8–23)
CO2: 23 mmol/L (ref 22–32)
Calcium: 9.3 mg/dL (ref 8.9–10.3)
Chloride: 107 mmol/L (ref 98–111)
Creatinine, Ser: 0.99 mg/dL (ref 0.61–1.24)
GFR, Estimated: >60 mL/min (ref >60)
Glucose, Bld: 105 mg/dL — ABNORMAL HIGH (ref 70–99)
Potassium: 4.1 mmol/L (ref 3.5–5.1)
Sodium: 139 mmol/L (ref 135–145)

## 2022-01-02 MED ORDER — MECLIZINE HCL 25 MG PO TABS: 25.0000 mg | ORAL_TABLET | Freq: Three times a day (TID) | ORAL | 0 refills | Status: DC | PRN

## 2022-01-02 NOTE — ED Triage Notes (Signed)
Pt reports certain ways he turns his head makes him feel dizzy. Pt also reports feeling dizzy when he bends over.

## 2022-01-02 NOTE — Discharge Instructions (Signed)
You have had labs (blood work) sent today. We will call you with any significant abnormalities or if there is need to begin or change treatment or pursue further follow up.  You may also review your test results online through MyChart. If you do not have a MyChart account, instructions to sign up should be on your discharge paperwork.  

## 2022-01-04 NOTE — ED Provider Notes (Signed)
[place]   [id number] 01/02/22 Arrival Time: 5681  ASSESSMENT & PLAN:  1. Vertigo   2. Lightheadedness    Normal neurologic exam. No suspicion for ICH or SAH. No indication for neurodiagnostic imaging at this time. Discussed. Symptoms are consistent with BPPV. Trial of: Meds ordered this encounter  Medications   meclizine (ANTIVERT) 25 MG tablet    Sig: Take 1 tablet (25 mg total) by mouth 3 (three) times daily as needed for dizziness.    Dispense:  21 tablet    Refill:  0   Labs Reviewed  CBC - Abnormal; Notable for the following components:      Result Value   Platelets 119 (*)    All other components within normal limits  BASIC METABOLIC PANEL - Abnormal; Notable for the following components:   Glucose, Bld 105 (*)    All other components within normal limits   Incidental finding of thrombocytopenia. He will need to have this recheck here or with PCP within one seek.  Reassured that these symptoms do not appear to represent a serious or threatening condition. This is generally a self-limited temporary but uncomfortable situation. Rest, avoid potentially dangerous activities (such as driving or working with machinery or at heights). Use OTC Meclizine prn. Will proceed to the ED if he develops other symptoms such as alterations of speech, swallowing, vision, motor/sensory systems, or if dizziness worsens.  Reviewed expectations re: course of current medical issues. Questions answered. Outlined signs and symptoms indicating need for more acute intervention. Patient verbalized understanding. After Visit Summary given.   SUBJECTIVE:  Donald Keith is a 76 y.o. male who reports "feeling dizzy when I turn my head a certain way"; gradual onset; x 1-2 days; random episodes; resolves when he can keep his head still. No coordination problems. Normal speech. No aphasia. Denies headaches, seizures, tremors, and weakness as well as otalgia and tinnitus. Recent infections:  none. Head trauma: denied. Noise exposure:  denied . No associated SOB, CP, or palpatations reported. Recent travel: none. Reports normal bowel/bladder habits. Previous workup/treatments: none. H/O similar: no. Therapies tried thus far: none.  Social History   Substance and Sexual Activity  Alcohol Use No   Social History   Tobacco Use  Smoking Status Never  Smokeless Tobacco Never   Denies recreational drug use.   OBJECTIVE:  Vitals:   01/02/22 1708  BP: 134/72  Pulse: 74  Resp: 18  Temp: 98.2 F (36.8 C)  SpO2: 97%    General appearance: alert; no distress Eyes: PERRLA; EOMI; conjunctiva normal HENT: normocephalic; atraumatic; TMs normal; nasal mucosa normal; oral mucosa normal Neck: supple with FROM Lungs: clear to auscultation bilaterally Heart: regular rate and rhythm Abdomen: soft, non-tender; bowel sounds normal Extremities: no cyanosis or edema; symmetrical with no gross deformities Skin: warm and dry Neurologic: normal gait; DTR's normal and symmetric; CN 2-12 grossly intact; rapid changes in position during the exam do precipitate brief dizziness without nystagmus. Psychological: alert and cooperative; normal mood and affect  Investigations: Results for orders placed or performed during the hospital encounter of 01/02/22  CBC  Result Value Ref Range   WBC 9.3 4.0 - 10.5 K/uL   RBC 4.92 4.22 - 5.81 MIL/uL   Hemoglobin 16.5 13.0 - 17.0 g/dL   HCT 47.9 39.0 - 52.0 %   MCV 97.4 80.0 - 100.0 fL   MCH 33.5 26.0 - 34.0 pg   MCHC 34.4 30.0 - 36.0 g/dL   RDW 13.1 11.5 - 15.5 %  Platelets 119 (L) 150 - 400 K/uL   nRBC 0.0 0.0 - 0.2 %  Basic metabolic panel  Result Value Ref Range   Sodium 139 135 - 145 mmol/L   Potassium 4.1 3.5 - 5.1 mmol/L   Chloride 107 98 - 111 mmol/L   CO2 23 22 - 32 mmol/L   Glucose, Bld 105 (H) 70 - 99 mg/dL   BUN 12 8 - 23 mg/dL   Creatinine, Ser 0.99 0.61 - 1.24 mg/dL   Calcium 9.3 8.9 - 10.3 mg/dL   GFR, Estimated >60 >60  mL/min   Anion gap 9 5 - 15   Labs Reviewed  CBC - Abnormal; Notable for the following components:      Result Value   Platelets 119 (*)    All other components within normal limits  BASIC METABOLIC PANEL - Abnormal; Notable for the following components:   Glucose, Bld 105 (*)    All other components within normal limits   No results found.  Allergies  Allergen Reactions   Codeine Nausea And Vomiting    Past Medical History:  Diagnosis Date   Arthritis    Hypertension 04/12/2016   Multinodular goiter    Thrombocytopenia (HCC)    Social History   Socioeconomic History   Marital status: Divorced    Spouse name: Not on file   Number of children: Not on file   Years of education: Not on file   Highest education level: Not on file  Occupational History   Not on file  Tobacco Use   Smoking status: Never   Smokeless tobacco: Never  Substance and Sexual Activity   Alcohol use: No   Drug use: No   Sexual activity: Not on file  Other Topics Concern   Not on file  Social History Narrative   Not on file   Social Determinants of Health   Financial Resource Strain: Not on file  Food Insecurity: Not on file  Transportation Needs: Not on file  Physical Activity: Not on file  Stress: Not on file  Social Connections: Not on file  Intimate Partner Violence: Not on file   Family History  Problem Relation Age of Onset   Heart attack Mother    Hypertension Mother    Diabetes Mother    Skin cancer Father    Heart disease Father    Diabetes Father    Diabetes Maternal Grandmother    Diabetes Maternal Grandfather    Diabetes Sister    Hyperlipidemia Sister    Past Surgical History:  Procedure Laterality Date   CERVICAL FUSION  2004   C5-7   COLONOSCOPY     INGUINAL HERNIA REPAIR Bilateral 2011   SHOULDER ARTHROSCOPY  2000   right rotator cuff       Vanessa Kick, MD 01/04/22 [phone]

## 2022-01-18 DIAGNOSIS — C44719 Basal cell carcinoma of skin of left lower limb, including hip: Secondary | ICD-10-CM | POA: Diagnosis not present

## 2022-01-25 DIAGNOSIS — C44311 Basal cell carcinoma of skin of nose: Secondary | ICD-10-CM | POA: Diagnosis not present

## 2022-02-17 DIAGNOSIS — R7303 Prediabetes: Secondary | ICD-10-CM | POA: Diagnosis not present

## 2022-02-17 DIAGNOSIS — Z0000 Encounter for general adult medical examination without abnormal findings: Secondary | ICD-10-CM | POA: Diagnosis not present

## 2022-02-17 DIAGNOSIS — E042 Nontoxic multinodular goiter: Secondary | ICD-10-CM | POA: Diagnosis not present

## 2022-02-17 DIAGNOSIS — Z1211 Encounter for screening for malignant neoplasm of colon: Secondary | ICD-10-CM | POA: Diagnosis not present

## 2022-02-17 DIAGNOSIS — Z23 Encounter for immunization: Secondary | ICD-10-CM | POA: Diagnosis not present

## 2022-02-17 DIAGNOSIS — Z125 Encounter for screening for malignant neoplasm of prostate: Secondary | ICD-10-CM | POA: Diagnosis not present

## 2022-02-17 DIAGNOSIS — H8111 Benign paroxysmal vertigo, right ear: Secondary | ICD-10-CM | POA: Diagnosis not present

## 2022-02-17 DIAGNOSIS — I10 Essential (primary) hypertension: Secondary | ICD-10-CM | POA: Diagnosis not present

## 2022-02-17 DIAGNOSIS — I2510 Atherosclerotic heart disease of native coronary artery without angina pectoris: Secondary | ICD-10-CM | POA: Diagnosis not present

## 2022-02-18 DIAGNOSIS — Z1211 Encounter for screening for malignant neoplasm of colon: Secondary | ICD-10-CM | POA: Diagnosis not present

## 2022-02-20 IMAGING — CT CT CARDIAC CORONARY ARTERY CALCIUM SCORE
3 series · 14 of 20 positions shown, 16 images · non-contrast
Comparison: None.

Addendum:
CLINICAL DATA: Cardiovascular Disease Risk stratification

EXAM:
Coronary Calcium Score
TECHNIQUE: A gated, non-contrast computed tomography scan of the heart was
performed using 3mm slice thickness. Axial images were analyzed on a
dedicated workstation. Calcium scoring of the coronary arteries was
performed using the Agatston method.

[Series 2: cascseq 2.0 sa36 (id) (id) · axial · 0.39mm/px · z∈[-240,-160]mm · 4 of 68 slices shown]
[im 14/68  vessel]
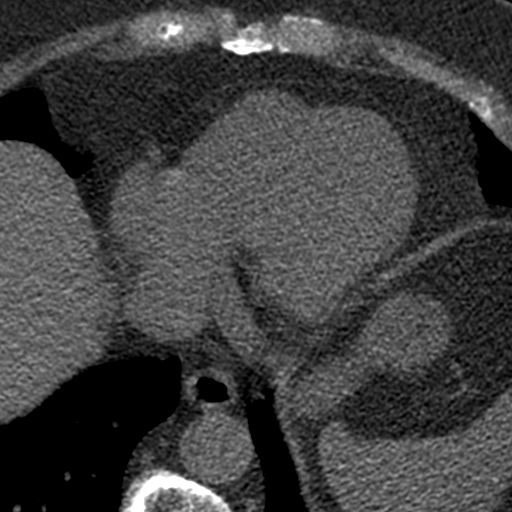
[im 27/68  vessel]
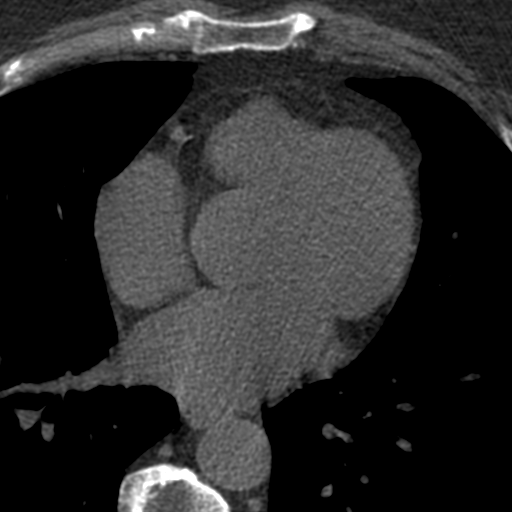
[im 41/68  vessel]
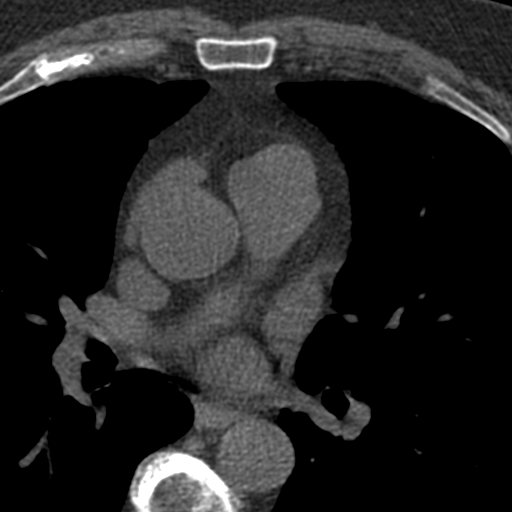
[im 54/68  vessel]
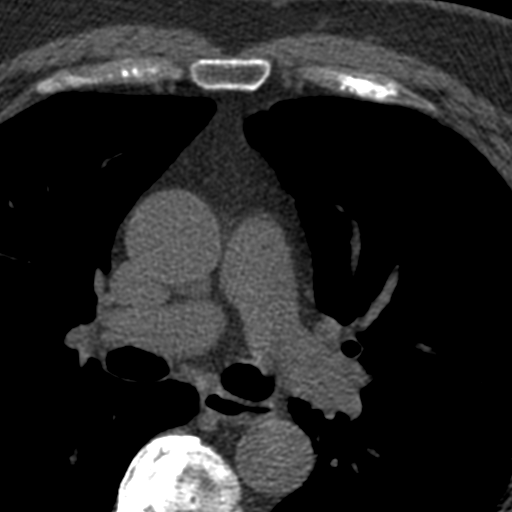

[Series 3: cascseq 2.0 bf37 st · axial · 0.73mm/px · z∈[-244,-156]mm · 5 of 68 slices shown, 7 images]
[im 12/68  vessel]
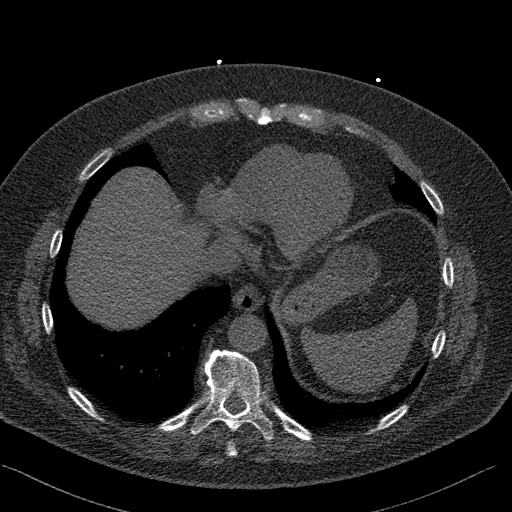
[im 12/68  lung]
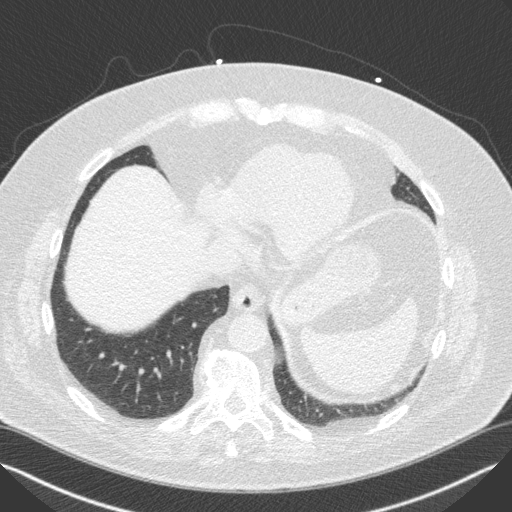
[im 23/68  vessel]
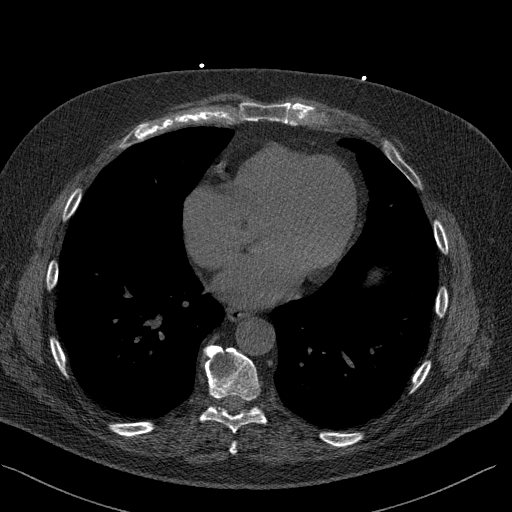
[im 34/68  vessel]
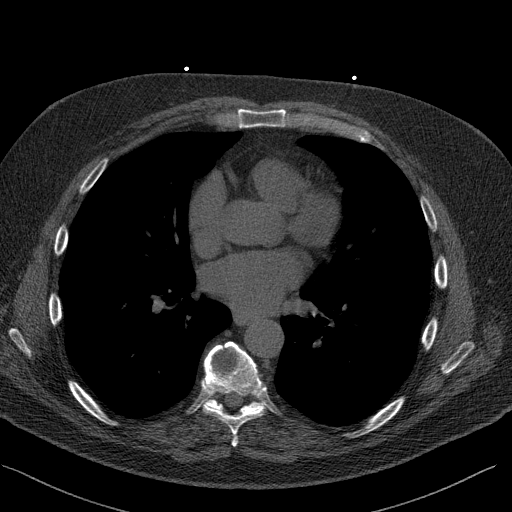
[im 45/68  vessel]
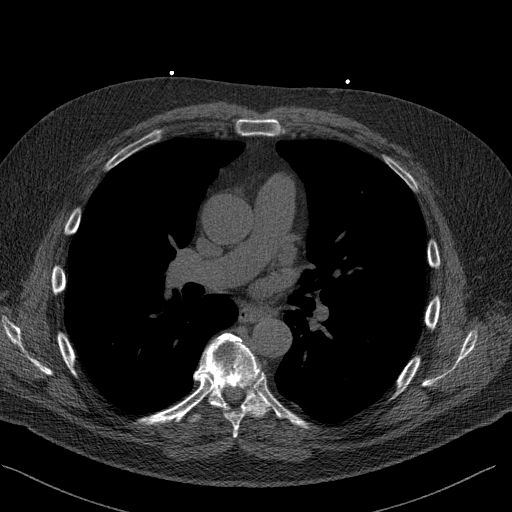
[im 56/68  vessel]
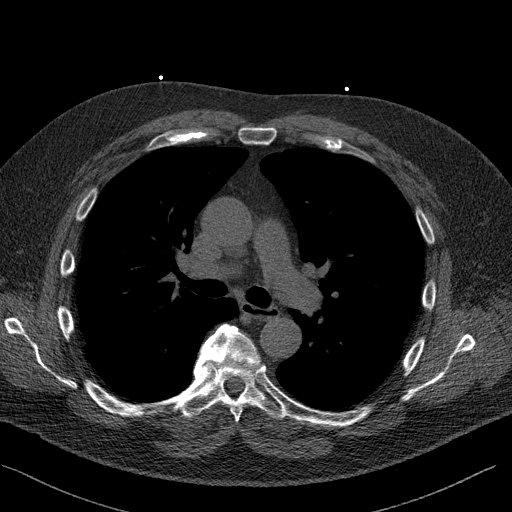
[im 56/68  lung]
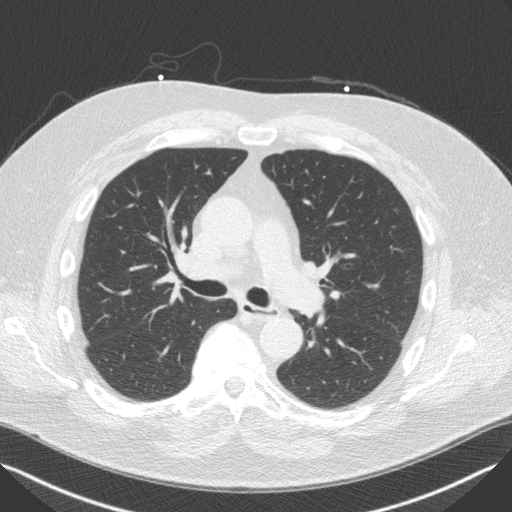

[Series 4: cascseq 2.0 br59 lung · axial · 0.73mm/px · z∈[-244,-156]mm · 5 of 68 slices shown]
[im 12/68  lung]
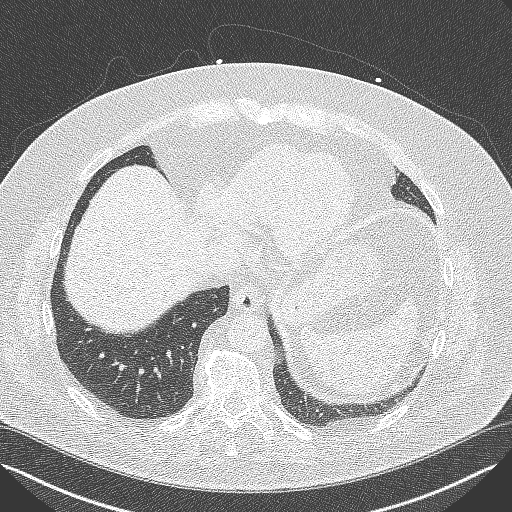
[im 23/68  lung]
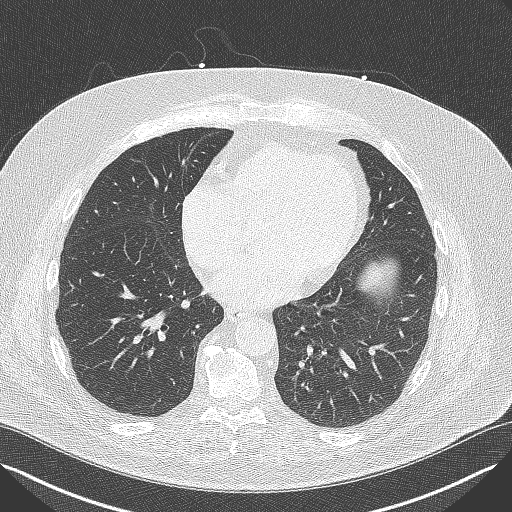
[im 34/68  lung]
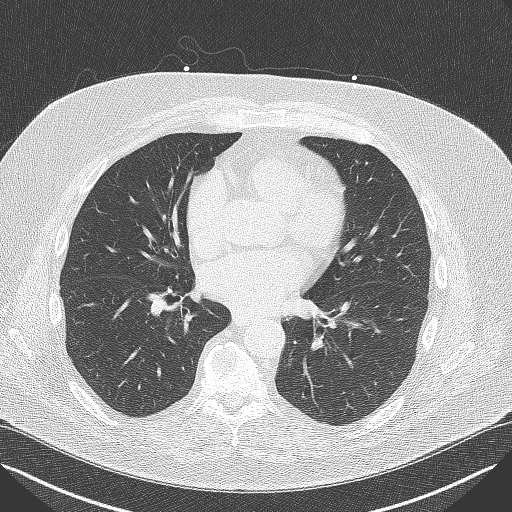
[im 45/68  lung]
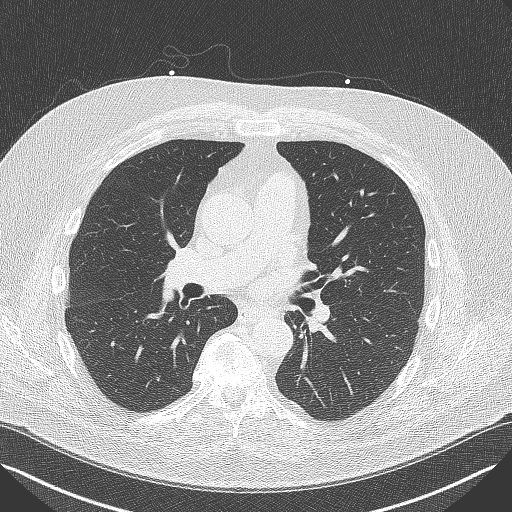
[im 56/68  lung]
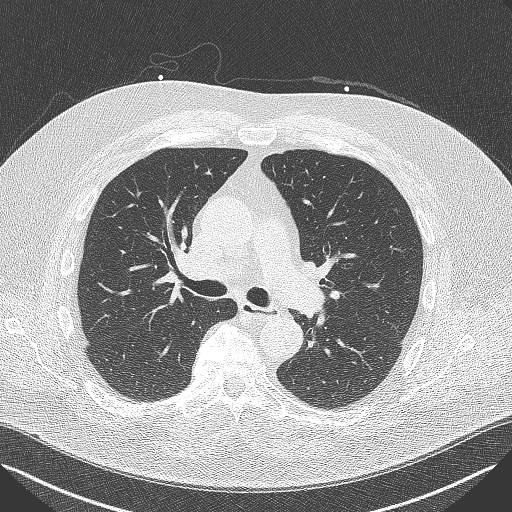

[14 of 20 positions shown; findings below may reference images not displayed]

FINDINGS: Coronary Calcium Score:

Left main: 0

Left anterior descending artery:

Left circumflex artery:

Right coronary artery: 43

Total: 144

Percentile: 42

Pericardium: Normal.

Ascending Aorta: Normal caliber.

Non-cardiac: See separate report from [REDACTED].
IMPRESSION: Coronary calcium score of 144. This was 42 percentile for age-,
race-, and sex-matched controls.



If CAC=0, it is reasonable to withhold statin therapy and reassess
in 5 to 10 years, as long as higher risk conditions are absent
(diabetes mellitus, family history of premature CHD in first degree
relatives (males <55 years; females <65 years), cigarette smoking,
or LDL >=190 mg/dL).

If CAC is 1 to 99, it is reasonable to initiate statin therapy for
patients >=55 years of age.

If CAC is >=100 or >=75th percentile, it is reasonable to initiate
statin therapy at any age.

Cardiology referral should be considered for patients with CAC
scores >=400 or >=75th percentile.

*7640 AHA/ACC/AACVPR/AAPA/ABC/HOSLOR/GUANDIQUE/THEODORA/Ann/FARM/AUJLA/DEVANG
Guideline on the Management of Blood Cholesterol: A Report of the
American College of Cardiology/American Heart Association Task Force
on Clinical Practice Guidelines. J Am Coll Cardiol.
0161;73(24):9612-9586.

EXAM:
OVER-READ INTERPRETATION  CT CHEST

The following report is an over-read performed by radiologist Dr.
Sukhbir Bazaldua [REDACTED] on 03/10/2021. This over-read
does not include interpretation of cardiac or coronary anatomy or
pathology. The calcium score interpretation by the cardiologist is
attached.
FINDINGS: Vascular: Aortic atherosclerosis.

Mediastinum/Nodes: No imaged thoracic adenopathy.

Lungs/Pleura: No pleural fluid.  Clear imaged lungs.

Upper Abdomen: Normal imaged portions of the liver, spleen, stomach.

Musculoskeletal: Advanced thoracic spondylosis.
IMPRESSION: No acute findings in the imaged extracardiac chest.

Aortic Atherosclerosis ([id number]-NSM.M).

*** End of Addendum ***
FINDINGS: Coronary Calcium Score:

Left main: 0

Left anterior descending artery:

Left circumflex artery:

Right coronary artery: 43

Total: 144

Percentile: 42

Pericardium: Normal.

Ascending Aorta: Normal caliber.

Non-cardiac: See separate report from [REDACTED].
IMPRESSION: Coronary calcium score of 144. This was 42 percentile for age-,
race-, and sex-matched controls.



If CAC=0, it is reasonable to withhold statin therapy and reassess
in 5 to 10 years, as long as higher risk conditions are absent
(diabetes mellitus, family history of premature CHD in first degree
relatives (males <55 years; females <65 years), cigarette smoking,
or LDL >=190 mg/dL).

If CAC is 1 to 99, it is reasonable to initiate statin therapy for
patients >=55 years of age.

If CAC is >=100 or >=75th percentile, it is reasonable to initiate
statin therapy at any age.

Cardiology referral should be considered for patients with CAC
scores >=400 or >=75th percentile.

*7640 AHA/ACC/AACVPR/AAPA/ABC/HOSLOR/GUANDIQUE/THEODORA/Ann/FARM/AUJLA/DEVANG
Guideline on the Management of Blood Cholesterol: A Report of the
American College of Cardiology/American Heart Association Task Force
on Clinical Practice Guidelines. J Am Coll Cardiol.
0161;73(24):9612-9586.

## 2022-08-02 NOTE — Progress Notes (Unsigned)
Cardiology Office Note   Date:  08/03/2022   ID:  Donald Keith, DOB 04/14/1946, MRN [id number]  PCP:  Shirline Frees, MD    No chief complaint on file.  HTN  Wt Readings from Last 3 Encounters:  08/03/22 233 lb 6.4 oz (105.9 kg)  02/18/21 234 lb (106.1 kg)  10/11/16 242 lb 11.2 oz (110.1 kg)       History of Present Illness: Donald Keith is a 77 y.o. male who I saw in October 2022 for the evaluation of lightheadedness.  As of October 2022: "Specifically, there was one episode in July 2022 of lighthededness, but he feels he was dehydrated.  He was drinking more sodas back then.  He is drinking more water now.    Climbs ladders, cleaning gutters, yard work is most strenuous activity.   Knee pain limits walking.  Does walk stairs in his house. No CP or SHOB.  Uses total gym at home. No symptoms with that activity.    Both parents had MI in their later years. Sister passed away at 94 with diabetes.    No smoking history.   Eats a lot of meat as part of regular diet, some processed.  "  Calcium scoring CT in November 2022 showed: "Left main: 0   Left anterior descending artery: 94.7   Left circumflex artery: 6.71   Right coronary artery: 43   Total: 144   Percentile: 42   Pericardium: Normal.   Ascending Aorta: Normal caliber."  Not walking much due to left knee pain.  Has not had it evaluated recently.  He had injections in the past.  He had a torn meniscus and it wa repaired.  Arthritis was found as well.    Past Medical History:  Diagnosis Date   Arthritis    Hypertension 04/12/2016   Multinodular goiter    Thrombocytopenia [facility])     Past Surgical History:  Procedure Laterality Date   CERVICAL FUSION  2004   C5-7   COLONOSCOPY     INGUINAL HERNIA REPAIR Bilateral 2011   SHOULDER ARTHROSCOPY  2000   right rotator cuff     Current Outpatient Medications  Medication Sig Dispense Refill   amLODipine (NORVASC) 5 MG tablet Take 5 mg by mouth  daily.     Multiple Vitamins-Minerals (MULTIVITAMIN WITH MINERALS) tablet Take 1 tablet by mouth daily.     rosuvastatin (CRESTOR) 10 MG tablet Take 1 tablet (10 mg total) by mouth daily. 90 tablet 3   meclizine (ANTIVERT) 25 MG tablet Take 1 tablet (25 mg total) by mouth 3 (three) times daily as needed for dizziness. (Patient not taking: Reported on 08/03/2022) 21 tablet 0   No current facility-administered medications for this visit.    Allergies:   Codeine    Social History:  The patient  reports that he has never smoked. He has never used smokeless tobacco. He reports that he does not drink alcohol and does not use drugs.   Family History:  The patient's family history includes Diabetes in his father, maternal grandfather, maternal grandmother, mother, and sister; Heart attack in his mother; Heart disease in his father; Hyperlipidemia in his sister; Hypertension in his mother; Skin cancer in his father.    ROS:  Please see the history of present illness.   Otherwise, review of systems are positive for knee pain.   All other systems are reviewed and negative.    PHYSICAL EXAM: VS:  BP 126/72  Pulse 84   Ht 5\' 11"  (1.803 m)   Wt 233 lb 6.4 oz (105.9 kg)   SpO2 96%   BMI 32.55 kg/m  , BMI Body mass index is 32.55 kg/m. GEN: Well nourished, well developed, in no acute distress HEENT: normal Neck: no JVD, carotid bruits, enlarged thyroid Cardiac: RRR; no murmurs, rubs, or gallops,no edema  Respiratory:  clear to auscultation bilaterally, normal work of breathing GI: soft, nontender, nondistended, + BS MS: no deformity or atrophy Skin: warm and dry, no rash Neuro:  Strength and sensation are intact Psych: euthymic mood, full affect   EKG:   The ekg ordered today demonstrates NSR, left axis deviation. Prolonged PR interval, septal Q waves   Recent Labs: 01/02/2022: BUN 12; Creatinine, Ser 0.99; Hemoglobin 16.5; Platelets 119; Potassium 4.1; Sodium 139   Lipid Panel     Component Value Date/Time   CHOL 108 06/11/2021 0725   TRIG 115 06/11/2021 0725   HDL 38 (L) 06/11/2021 0725   CHOLHDL 2.8 06/11/2021 0725   LDLCALC 49 06/11/2021 0725     Other studies Reviewed: Additional studies/ records that were reviewed today with results demonstrating: October 2023 total cholesterol 109 LDL 48 triglycerides 122 HDL 40 A1c 6.0.   ASSESSMENT AND PLAN:  Hypertension: The current medical regimen is effective;  continue present plan and medications.  COntrolled at home as well.  For better blood pressure control without meds, more exercise will be helpful.May need to go back to orthopedics to see if something can be done to help the left knee and allow more activity.   Family history of CAD: Mother, father , sister with heart problems.  Unclear whether it was CAD.  Father may have had CAD. Coronary artery calcification: some shortness of breath which has been stable.   Check echocardiogram to evaluate the left ventricular ejection fraction.  Septal Q wave noted oin ECG as well.  On rosuvastatin after coronary calcification was found.   Current medicines are reviewed at length with the patient today.  The patient concerns regarding his medicines were addressed.  The following changes have been made:  No change  Labs/ tests ordered today include:  No orders of the defined types were placed in this encounter.   Recommend 150 minutes/week of aerobic exercise Low fat, low carb, high fiber diet recommended  Disposition:   FU in 1 year   Signed, Larae Grooms, MD  08/03/2022 10:54 AM    [facility] [place], [place], [place]  [address] Phone: [phone]; Fax: [phone]

## 2022-08-03 ENCOUNTER — Ambulatory Visit: Payer: Medicare HMO | Attending: Interventional Cardiology | Admitting: Interventional Cardiology

## 2022-08-03 ENCOUNTER — Encounter: Payer: Self-pay | Admitting: Interventional Cardiology

## 2022-08-03 VITALS — BP 126/72 | HR 84 | Ht 71.0 in | Wt 233.4 lb

## 2022-08-03 DIAGNOSIS — I10 Essential (primary) hypertension: Secondary | ICD-10-CM

## 2022-08-03 DIAGNOSIS — R42 Dizziness and giddiness: Secondary | ICD-10-CM | POA: Diagnosis not present

## 2022-08-03 DIAGNOSIS — I2584 Coronary atherosclerosis due to calcified coronary lesion: Secondary | ICD-10-CM | POA: Diagnosis not present

## 2022-08-03 DIAGNOSIS — Z8249 Family history of ischemic heart disease and other diseases of the circulatory system: Secondary | ICD-10-CM | POA: Diagnosis not present

## 2022-08-03 DIAGNOSIS — R0602 Shortness of breath: Secondary | ICD-10-CM | POA: Diagnosis not present

## 2022-08-03 DIAGNOSIS — I2510 Atherosclerotic heart disease of native coronary artery without angina pectoris: Secondary | ICD-10-CM | POA: Diagnosis not present

## 2022-08-03 NOTE — Patient Instructions (Signed)
Medication Instructions:  Your physician recommends that you continue on your current medications as directed. Please refer to the Current Medication list given to you today.  *If you need a refill on your cardiac medications before your next appointment, please call your pharmacy*   Lab Work: none If you have labs (blood work) drawn today and your tests are completely normal, you will receive your results only by: MyChart Message (if you have MyChart) OR A paper copy in the mail If you have any lab test that is abnormal or we need to change your treatment, we will call you to review the results.   Testing/Procedures: Your physician has requested that you have an echocardiogram. Echocardiography is a painless test that uses sound waves to create images of your heart. It provides your doctor with information about the size and shape of your heart and how well your heart's chambers and valves are working. This procedure takes approximately one hour. There are no restrictions for this procedure. Please do NOT wear cologne, perfume, aftershave, or lotions (deodorant is allowed). Please arrive 15 minutes prior to your appointment time.    Follow-Up: At [facility], you and your health needs are our priority.  As part of our continuing mission to provide you with exceptional heart care, we have created designated Provider Care Teams.  These Care Teams include your primary Cardiologist (physician) and Advanced Practice Providers (APPs -  Physician Assistants and Nurse Practitioners) who all work together to provide you with the care you need, when you need it.  We recommend signing up for the patient portal called "MyChart".  Sign up information is provided on this After Visit Summary.  MyChart is used to connect with patients for Virtual Visits (Telemedicine).  Patients are able to view lab/test results, encounter notes, upcoming appointments, etc.  Non-urgent messages can be sent to your  provider as well.   To learn more about what you can do with MyChart, go to [email or web address].    Your next appointment:   12 month(s)  Provider:   Jayadeep Varanasi, MD     Other Instructions    

## 2022-08-19 DIAGNOSIS — R7303 Prediabetes: Secondary | ICD-10-CM | POA: Diagnosis not present

## 2022-08-19 DIAGNOSIS — M150 Primary generalized (osteo)arthritis: Secondary | ICD-10-CM | POA: Diagnosis not present

## 2022-08-19 DIAGNOSIS — D696 Thrombocytopenia, unspecified: Secondary | ICD-10-CM | POA: Diagnosis not present

## 2022-08-19 DIAGNOSIS — E042 Nontoxic multinodular goiter: Secondary | ICD-10-CM | POA: Diagnosis not present

## 2022-08-19 DIAGNOSIS — I10 Essential (primary) hypertension: Secondary | ICD-10-CM | POA: Diagnosis not present

## 2022-08-19 DIAGNOSIS — I2510 Atherosclerotic heart disease of native coronary artery without angina pectoris: Secondary | ICD-10-CM | POA: Diagnosis not present

## 2022-08-26 ENCOUNTER — Other Ambulatory Visit: Payer: Self-pay | Admitting: Family Medicine

## 2022-08-26 DIAGNOSIS — E042 Nontoxic multinodular goiter: Secondary | ICD-10-CM

## 2022-08-30 ENCOUNTER — Encounter: Payer: Self-pay | Admitting: Family Medicine

## 2022-09-02 ENCOUNTER — Ambulatory Visit (HOSPITAL_COMMUNITY): Payer: Medicare HMO | Attending: Interventional Cardiology

## 2022-09-02 DIAGNOSIS — R0602 Shortness of breath: Secondary | ICD-10-CM

## 2022-09-03 LAB — ECHOCARDIOGRAM COMPLETE
Area-P 1/2: 3.43 cm2
S' Lateral: 3.2 cm

## 2022-09-14 ENCOUNTER — Ambulatory Visit
Admission: RE | Admit: 2022-09-14 | Discharge: 2022-09-14 | Disposition: A | Discharge status: Home / Self Care | Payer: Medicare HMO | Source: Ambulatory Visit | Attending: Family Medicine | Admitting: Family Medicine

## 2022-09-14 DIAGNOSIS — E042 Nontoxic multinodular goiter: Secondary | ICD-10-CM | POA: Diagnosis not present

## 2022-09-17 ENCOUNTER — Ambulatory Visit: Payer: Medicare HMO | Admitting: Cardiovascular Disease

## 2023-03-01 DIAGNOSIS — Z0000 Encounter for general adult medical examination without abnormal findings: Secondary | ICD-10-CM | POA: Diagnosis not present

## 2023-03-01 DIAGNOSIS — Z23 Encounter for immunization: Secondary | ICD-10-CM | POA: Diagnosis not present

## 2023-03-02 DIAGNOSIS — Z1211 Encounter for screening for malignant neoplasm of colon: Secondary | ICD-10-CM | POA: Diagnosis not present

## 2023-04-25 DIAGNOSIS — Z961 Presence of intraocular lens: Secondary | ICD-10-CM | POA: Diagnosis not present

## 2023-04-25 DIAGNOSIS — H43812 Vitreous degeneration, left eye: Secondary | ICD-10-CM | POA: Diagnosis not present

## 2023-04-26 DIAGNOSIS — Z0100 Encounter for examination of eyes and vision without abnormal findings: Secondary | ICD-10-CM | POA: Diagnosis not present

## 2023-08-16 DIAGNOSIS — D696 Thrombocytopenia, unspecified: Secondary | ICD-10-CM | POA: Diagnosis not present

## 2023-08-16 DIAGNOSIS — M150 Primary generalized (osteo)arthritis: Secondary | ICD-10-CM | POA: Diagnosis not present

## 2023-08-16 DIAGNOSIS — R7303 Prediabetes: Secondary | ICD-10-CM | POA: Diagnosis not present

## 2023-08-16 DIAGNOSIS — I10 Essential (primary) hypertension: Secondary | ICD-10-CM | POA: Diagnosis not present

## 2023-08-16 DIAGNOSIS — E042 Nontoxic multinodular goiter: Secondary | ICD-10-CM | POA: Diagnosis not present

## 2023-08-16 DIAGNOSIS — I2510 Atherosclerotic heart disease of native coronary artery without angina pectoris: Secondary | ICD-10-CM | POA: Diagnosis not present

## 2023-08-16 DIAGNOSIS — E7800 Pure hypercholesterolemia, unspecified: Secondary | ICD-10-CM | POA: Diagnosis not present

## 2023-12-08 DIAGNOSIS — I2510 Atherosclerotic heart disease of native coronary artery without angina pectoris: Secondary | ICD-10-CM | POA: Diagnosis not present

## 2023-12-08 DIAGNOSIS — I10 Essential (primary) hypertension: Secondary | ICD-10-CM | POA: Diagnosis not present

## 2023-12-08 DIAGNOSIS — M150 Primary generalized (osteo)arthritis: Secondary | ICD-10-CM | POA: Diagnosis not present

## 2023-12-08 DIAGNOSIS — E7800 Pure hypercholesterolemia, unspecified: Secondary | ICD-10-CM | POA: Diagnosis not present

## 2024-01-08 DIAGNOSIS — I10 Essential (primary) hypertension: Secondary | ICD-10-CM | POA: Diagnosis not present

## 2024-01-08 DIAGNOSIS — M150 Primary generalized (osteo)arthritis: Secondary | ICD-10-CM | POA: Diagnosis not present

## 2024-01-08 DIAGNOSIS — I2510 Atherosclerotic heart disease of native coronary artery without angina pectoris: Secondary | ICD-10-CM | POA: Diagnosis not present

## 2024-01-08 DIAGNOSIS — E7800 Pure hypercholesterolemia, unspecified: Secondary | ICD-10-CM | POA: Diagnosis not present

## 2024-02-07 DIAGNOSIS — M150 Primary generalized (osteo)arthritis: Secondary | ICD-10-CM | POA: Diagnosis not present

## 2024-02-07 DIAGNOSIS — I2510 Atherosclerotic heart disease of native coronary artery without angina pectoris: Secondary | ICD-10-CM | POA: Diagnosis not present

## 2024-02-07 DIAGNOSIS — I10 Essential (primary) hypertension: Secondary | ICD-10-CM | POA: Diagnosis not present

## 2024-02-07 DIAGNOSIS — E7800 Pure hypercholesterolemia, unspecified: Secondary | ICD-10-CM | POA: Diagnosis not present

## 2024-02-28 DIAGNOSIS — Z0000 Encounter for general adult medical examination without abnormal findings: Secondary | ICD-10-CM | POA: Diagnosis not present

## 2024-02-29 DIAGNOSIS — Z1211 Encounter for screening for malignant neoplasm of colon: Secondary | ICD-10-CM | POA: Diagnosis not present

## 2024-03-09 DIAGNOSIS — I2510 Atherosclerotic heart disease of native coronary artery without angina pectoris: Secondary | ICD-10-CM | POA: Diagnosis not present

## 2024-03-09 DIAGNOSIS — M150 Primary generalized (osteo)arthritis: Secondary | ICD-10-CM | POA: Diagnosis not present

## 2024-03-09 DIAGNOSIS — I10 Essential (primary) hypertension: Secondary | ICD-10-CM | POA: Diagnosis not present

## 2024-03-09 DIAGNOSIS — E7800 Pure hypercholesterolemia, unspecified: Secondary | ICD-10-CM | POA: Diagnosis not present

## 2024-03-16 NOTE — Progress Notes (Signed)
    Cardiology Office Note Date:  03/19/2024  ID:  Donald Keith, DOB 13-Nov-1945, MRN [id number] PCP:  Arloa Elsie JONELLE, MD  Cardiologist:  Joelle VEAR Ren Donley, MD  No chief complaint on file.     Problems Elevated CAC 144 11/22 4/24: 55-60%, mild LVH, GIDD AE5, RN10  Visits  [facility] 3/24: 2D Echo 11/25: HA1C, LP    History of Present Illness: Donald Keith is a 78 y.o. male who presents for follow up.  He has been doing well since last visit.  He has been very inactive until about a month ago when he bought a stationary bike.  He gets on and about every day for up to 10 minutes.  He has to stop because of his left knee pain.  He denies any chest pain or dyspnea with exertion.  He is currently retired and has been keeping busy by mowing the grass and raking leaves.  He checks his blood pressure at home and he has been 120s over 70s.    ROS: Please see the history of present illness. All other systems are reviewed and negative.   PHYSICAL EXAM: VS:  BP 128/72 (BP Location: Left Arm, Patient Position: Sitting, Cuff Size: Large)   Pulse 91   Ht 5' 11 (1.803 m)   Wt 238 lb (108 kg)   SpO2 95%   BMI 33.19 kg/m  , BMI Body mass index is 33.19 kg/m. GEN: Well nourished, well developed, in no acute distress HEENT: normal Neck: no JVD, carotid bruits, or masses Cardiac: RRR; no murmurs, rubs, or gallops,no edema  Respiratory:  CTAB bilaterally, normal work of breathing GI: soft, nontender, nondistended, + BS Extremities: No LE edema Skin: warm and dry, no rash Neuro:  Strength and sensation are intact  Recent Labs: Reviewed  Studies: Reviewed  ASSESSMENT AND PLAN: Donald Keith is a 78 y.o. male who presents for new visit.  - From CAD standpoint he seems to be asymptomatic.  We will check lipid panel and A1c today. - Follow-up with Dayna Dunn in about a year.    Signed, Joelle VEAR Ren Donley, MD  03/19/2024 10:59 AM    [facility]

## 2024-03-19 ENCOUNTER — Ambulatory Visit

## 2024-03-19 VITALS — BP 128/72 | HR 91 | Ht 71.0 in | Wt 238.0 lb

## 2024-03-19 DIAGNOSIS — I10 Essential (primary) hypertension: Secondary | ICD-10-CM

## 2024-03-19 DIAGNOSIS — I2510 Atherosclerotic heart disease of native coronary artery without angina pectoris: Secondary | ICD-10-CM

## 2024-03-19 LAB — LIPID PANEL

## 2024-03-19 MED ORDER — ROSUVASTATIN CALCIUM 10 MG PO TABS: 10.0000 mg | ORAL_TABLET | Freq: Every day | ORAL | 3 refills | Status: AC

## 2024-03-19 NOTE — Patient Instructions (Signed)
 Medication Instructions:  Your physician recommends that you continue on your current medications as directed. Please refer to the Current Medication list given to you today.  *If you need a refill on your cardiac medications before your next appointment, please call your pharmacy*  Lab Work: TODAY: Lipid, A1c If you have labs (blood work) drawn today and your tests are completely normal, you will receive your results only by: MyChart Message (if you have MyChart) OR A paper copy in the mail If you have any lab test that is abnormal or we need to change your treatment, we will call you to review the results.  Testing/Procedures: NONE  Follow-Up: At [facility], you and your health needs are our priority.  As part of our continuing mission to provide you with exceptional heart care, our providers are all part of one team.  This team includes your primary Cardiologist (physician) and Advanced Practice Providers or APPs (Physician Assistants and Nurse Practitioners) who all work together to provide you with the care you need, when you need it.  Your next appointment:   1 year(s)  Provider:   Dayna Dunn, PA-C

## 2024-03-20 ENCOUNTER — Ambulatory Visit: Payer: Self-pay

## 2024-03-20 LAB — HEMOGLOBIN A1C
Est. average glucose Bld gHb Est-mCnc: 120 mg/dL
Hgb A1c MFr Bld: 5.8 % — ABNORMAL HIGH (ref 4.8–5.6)

## 2024-03-20 LAB — LIPID PANEL
Cholesterol, Total: 102 mg/dL (ref 100–199)
HDL: 36 mg/dL — AB (ref >39)
LDL CALC COMMENT:: 2.8 ratio (ref 0.0–5.0)
LDL Chol Calc (NIH): 44 mg/dL (ref 0–99)
Triglycerides: 121 mg/dL (ref 0–149)
VLDL Cholesterol Cal: 22 mg/dL (ref 5–40)

## 2024-04-30 DIAGNOSIS — H43812 Vitreous degeneration, left eye: Secondary | ICD-10-CM | POA: Diagnosis not present

## 2024-04-30 DIAGNOSIS — Z961 Presence of intraocular lens: Secondary | ICD-10-CM | POA: Diagnosis not present
